# Patient Record
Sex: Female | Born: 1980 | Race: White | Hispanic: No | Smoking: Current every day smoker
Health system: Southern US, Community
[De-identification: ages and names within clinical notes are randomized; demographics above are authoritative.]

## PROBLEM LIST (undated history)

## (undated) DIAGNOSIS — G8929 Other chronic pain: Secondary | ICD-10-CM

## (undated) DIAGNOSIS — M549 Dorsalgia, unspecified: Secondary | ICD-10-CM

## (undated) DIAGNOSIS — R52 Pain, unspecified: Secondary | ICD-10-CM

## (undated) HISTORY — PX: BACK SURGERY: SHX140

## (undated) HISTORY — PX: TOOTH EXTRACTION: SUR596

## (undated) HISTORY — PX: CHOLECYSTECTOMY: SHX55

## (undated) HISTORY — PX: ABDOMINAL HYSTERECTOMY: SHX81

---

## 2015-04-09 ENCOUNTER — Emergency Department (HOSPITAL_COMMUNITY)
Admission: EM | Admit: 2015-04-09 | Discharge: 2015-04-10 | Disposition: A | Discharge status: Home / Self Care | Payer: Self-pay | Attending: Emergency Medicine | Admitting: Emergency Medicine

## 2015-04-09 ENCOUNTER — Encounter (HOSPITAL_COMMUNITY): Payer: Self-pay | Admitting: Emergency Medicine

## 2015-04-09 DIAGNOSIS — Z720 Tobacco use: Secondary | ICD-10-CM | POA: Insufficient documentation

## 2015-04-09 DIAGNOSIS — N939 Abnormal uterine and vaginal bleeding, unspecified: Secondary | ICD-10-CM | POA: Insufficient documentation

## 2015-04-09 DIAGNOSIS — Z90710 Acquired absence of both cervix and uterus: Secondary | ICD-10-CM | POA: Insufficient documentation

## 2015-04-09 DIAGNOSIS — Z793 Long term (current) use of hormonal contraceptives: Secondary | ICD-10-CM | POA: Insufficient documentation

## 2015-04-09 DIAGNOSIS — IMO0002 Reserved for concepts with insufficient information to code with codable children: Secondary | ICD-10-CM

## 2015-04-09 DIAGNOSIS — N941 Dyspareunia: Secondary | ICD-10-CM | POA: Insufficient documentation

## 2015-04-09 DIAGNOSIS — Z79899 Other long term (current) drug therapy: Secondary | ICD-10-CM | POA: Insufficient documentation

## 2015-04-09 MED ORDER — OXYCODONE-ACETAMINOPHEN 5-325 MG PO TABS
1.0000 | ORAL_TABLET | Freq: Once | ORAL | Status: AC
  Administered 2015-04-09: 1 via ORAL
  Filled 2015-04-09: qty 1

## 2015-04-09 NOTE — ED Notes (Signed)
Pt. Reports having surgery 7 weeks ago to fix lesions from previous hysterectomy surgery. Pt. Reports bleeding today.

## 2015-04-10 MED ORDER — OXYCODONE-ACETAMINOPHEN 5-325 MG PO TABS: 1.0000 | ORAL_TABLET | Freq: Four times a day (QID) | ORAL | Status: DC | PRN

## 2015-04-10 NOTE — Discharge Instructions (Signed)
You were seen today for painful sex and some bleeding after sex.  Your vaginal cuff appears intact.  You need to follow-up with your OB on Tuesday.  Avoid sexual activity until you talk to you obstetrician.  Dyspareunia Dyspareunia is pain during sexual intercourse. It is most common in women, but it also happens in men.  CAUSES  Female The pain from this condition is usually felt when anything is put into the vagina, but any part of the genitals may cause pain during sex. Even sitting or wearing pants can cause pain. Sometimes, a cause cannot be found. Some causes of pain during intercourse are:  Infections of the skin around the vagina.  Vaginal infections, such as a yeast, bacterial, or viral infection.  Vaginismus. This is the inability to have anything put in the vagina even when the woman wants it to happen. There is an automatic muscle contraction and pain. The pain of the muscle contraction can be so severe that intercourse is impossible.  Allergic reaction from spermicides, semen, condoms, scented tampons, soaps, douches, and vaginal sprays.  A fluid-filled sac (cyst) on the Bartholin or Skene glands, located at the opening of the vagina.  Scar tissue in the vagina from a surgically enlarged opening (episiotomy) or tearing after delivering a baby.  Vaginal dryness. This is more common in menopause. The normal secretions of the vagina are decreased. Changes in estrogen levels and increased difficulty becoming aroused can cause painful sex. Vaginal dryness can also happen when taking birth control pills.  Thinning of the tissue (atrophy) of the vulva and vagina. This makes the area thinner, smaller, unable to stretch to accommodate a penis, and prone to infection and tearing.  Vulvar vestibulitis or vestibulodynia.This is a condition that causes pain involving the area around the entrance to the vagina.The most common cause in young women is birth control pills.Women with low  estrogen levels (postmenopausal women) may also experience this.Other causes include allergic reactions, too many nerve endings, skin conditions, and pelvic muscles that cannot relax.  Vulvar dermatoses. This includes skin conditions such as lichen sclerosus and lichen planus.  Lack of foreplay to lubricate the vagina. This can cause vaginal dryness.  Noncancerous tumors (fibroids) in the uterus.  Uterus lining tissue growing outside the uterus (endometriosis).  Pregnancy that starts in the fallopian tube (tubal pregnancy).  Pregnancy or breastfeeding your baby. This can cause vaginal dryness.  A tilting or prolapse of the uterus. Prolapse is when weak and stretched muscles around the uterus allow it to fall into the vagina.  Problems with the ovaries, cysts, or scar tissue. This may be worse with certain sexual positions.  Previous surgeries causing adhesions or scar tissue in the vagina or pelvis.  Bladder and intestinal problems.  Psychological problems (such as depression or anxiety). This may make pain worse.  Negative attitudes about sex, experiencing rape, sexual assault, and misinformation about sex. These issues are often related to some types of pain.  Previous pelvic infection, causing scar tissue in the pelvis and on the female organs.  Cyst or tumor on the ovary.  Cancer of the female organs.  Certain medicines.  Medical problems such as diabetes, arthritis, or thyroid disease. Female In men, there are many physical causes of sexual discomfort. Some causes of pain during intercourse are:  Infections of the prostate, bladder, or seminal vesicles. This can cause pain after ejaculation.  An inflamed bladder (interstitial cystitis). This may cause pain from ejaculation.  Gonorrheal infections. This may cause  pain during ejaculation.  An inflamed urethra (urethritis) or inflamed prostate (prostatitis). This can make genital stimulation painful or  uncomfortable.  Deformities of the penis, such as Peyronie's disease.  A tight foreskin.  Cancer of the female organs.  Psychological problems. This may make pain worse. DIAGNOSIS   Your caregiver will take a history and have you describe where the pain is located (outside the vagina, in the vagina, in the pelvis). You may be asked when you experience pain, such as with penetration or with thrusting.  Following this, your caregiver will do a physical exam. Let your caregiver know if the exam is too painful.  During the final part of the female exam, your caregiver will feel your uterus and ovaries with one hand on the abdomen and one finger in your vagina. This is a pelvic exam.  Blood tests, a Pap test, cultures for infection, an ultrasound test, and X-rays may be done. You may need to see a specialist for female problems (gynecologist).  Your caregiver may do a CT scan, MRI, or laparoscopy. Laparoscopy is a procedure to look into the pelvis with a lighted tube, through a cut (incision) in the abdomen. TREATMENT  Your caregiver can help you determine the best course of treatment. Sometimes, more testing is done. Continue with the suggested testing until your caregiver feels sure about your diagnosis and how to treat it. Sometimes, it is difficult to find the reason for the pain. The search for the cause and treatment can be frustrating. Treatment often takes several weeks to a few months before you notice any improvement. You may also need to avoid sexual activity until symptoms improve.Continuing to have sex when it hurts can delay healing and actually make the problem worse. The treatment depends on the cause of the pain. Treatment may include:  Medicines such as antibiotics, vaginal or skin creams, hormones, or antidepressants.  Minor or major surgery.  Psychological counseling or group therapy.  Kegel exercises and vaginal dilators to help certain cases of vaginismus (spasms). Do  this only if recommended by your caregiver.Kegel exercises can make some problems worse.  Applying lubrication as recommended by your caregiver if you have dryness.  Sex therapy for you and your sex partner. It is common for the pain to continue after the reason for the pain has been treated. Some reasons for this include a conditioned response. This means the person having the pain becomes so familiar with the pain that the pain continues as a response, even though the cause is removed. Sex therapy can help with this problem. HOME CARE INSTRUCTIONS   Follow your caregiver's instructions about taking medicines, tests, counseling, and follow-up treatment.  Do not use scented tampons, douches, vaginal sprays, or soaps.  Use water-based lubricants for dryness. Oil lubricants can cause irritation.  Do not use spermicides or condoms that irritate you.  Openly discuss with your partner your sexual experience, your desires, foreplay, and different sexual positions for a more comfortable and enjoyable sexual relationship.  Join group sessions for therapy, if needed.  Practice safe sex at all times.  Empty your bladder before having intercourse.  Try different positions during sexual intercourse.  Take over-the-counter pain medicine recommended by your caregiver before having sexual intercourse.  Do not wear pantyhose. Knee-high and thigh-high hose are okay.  Avoid scrubbing your vulva with a washcloth. Wash the area gently and pat dry with a towel. SEEK MEDICAL CARE IF:   You develop vaginal bleeding after sexual intercourse.  You develop a lump at the opening of your vagina, even if it is not painful.  You have abnormal vaginal discharge.  You have vaginal dryness.  You have itching or irritation of the vulva or vagina.  You develop a rash or reaction to your medicine. SEEK IMMEDIATE MEDICAL CARE IF:   You develop severe abdominal pain during or shortly after sexual  intercourse. You could have a ruptured ovarian cyst or ruptured tubal pregnancy.  You have a fever.  You have painful or bloody urination.  You have painful sexual intercourse, and you never had it before.  You pass out after having sexual intercourse. Document Released: 10/14/2007 Document Revised: 12/17/2011 Document Reviewed: 12/25/2010 [facility] Patient Information 2015 [place], [place]. This information is not intended to replace advice given to you by your health care provider. Make sure you discuss any questions you have with your health care provider.

## 2015-04-10 NOTE — ED Provider Notes (Signed)
CSN: [id number]     Arrival date & time 04/09/15  2242 History   First MD Initiated Contact with Patient 04/09/15 2305     Chief Complaint  Patient presents with  . Post-op Problem     (Consider location/radiation/quality/duration/timing/severity/associated sxs/prior Treatment) HPI  This is a 34 year old female with a history of a total abdominal hysterectomy presents with vaginal pain and bleeding. Patient reports that she had a hysterectomy several years ago. 7 weeks ago she had revision. When asked if she has a cervix, patient states "I have nothing." Patient states that she had sexual intercourse for the first time since having surgery last night. She has reported spotting on and off today and pain in the vaginal vault. She had pain during sex as well. She denies any abdominal pain or urinary symptoms. [facility]  She's not tried anything for the pain at home.  Currently her pain is 8 out of 10.  Patient denies fever, dysuria, nausea, vomiting.  History reviewed. No pertinent past medical history. Past Surgical History  Procedure Laterality Date  . Abdominal hysterectomy     No family history on file. History  Substance Use Topics  . Smoking status: Current Every Day Smoker -- 0.50 packs/day    Types: Cigarettes  . Smokeless tobacco: Not on file  . Alcohol Use: No   OB History    No data available     Review of Systems  Constitutional: Negative for fever.  Gastrointestinal: Negative for nausea, vomiting and abdominal pain.  Genitourinary: Positive for vaginal bleeding and vaginal pain. Negative for dysuria, vaginal discharge and pelvic pain.  All other systems reviewed and are negative.     Allergies  Codeine and Toradol  Home Medications   Prior to Admission medications   Medication Sig Start Date End Date Taking? Authorizing Provider  acetaminophen (TYLENOL) 500 MG tablet Take 500 mg by mouth every 6 (six) hours as needed for mild pain.    Yes Historical Provider, MD  albuterol (PROVENTIL HFA;VENTOLIN HFA) 108 (90 BASE) MCG/ACT inhaler Inhale 2 puffs into the lungs every 6 (six) hours as needed for wheezing or shortness of breath.   Yes Historical Provider, MD  citalopram (CELEXA) 40 MG tablet Take 40 mg by mouth daily.   Yes Historical Provider, MD  estrogens, conjugated, (PREMARIN) 1.25 MG tablet Take 1.25 mg by mouth daily.   Yes Historical Provider, MD  oxyCODONE-acetaminophen (PERCOCET/ROXICET) 5-325 MG per tablet Take 1 tablet by mouth every 6 (six) hours as needed for severe pain. 04/10/15   Shon Batonourtney F Treshawn Allen, MD   BP 105/70 mmHg  Pulse 76  Temp(Src) 98.2 F (36.8 C) (Oral)  Resp 20  Ht 5\' 5"  (1.651 m)  Wt 181 lb (82.101 kg)  BMI 30.12 kg/m2  SpO2 98% Physical Exam  Constitutional: She is oriented to person, place, and time. She appears well-developed and well-nourished.  HENT:  Head: Normocephalic and atraumatic.  Cardiovascular: Normal rate and regular rhythm.   Pulmonary/Chest: Effort normal. No respiratory distress.  Abdominal: Soft. Bowel sounds are normal. There is no tenderness. There is no rebound and no guarding.  Genitourinary:  Normal external vaginal exam, pain with insertion of the vaginal speculum, no lesions noted, no abrasions or trauma noted, no active bleeding, vaginal cuff appears intact  Neurological: She is alert and oriented to person, place, and time.  Skin: Skin is warm and dry.  Psychiatric: She has a normal mood and affect.  Nursing note and  vitals reviewed.   ED Course  Procedures (including critical care time) Labs Review Labs Reviewed - No data to display  Imaging Review No results found.   EKG Interpretation None      MDM   Final diagnoses:  Dyspareunia    Patient presents with dyspareunia and spotting after sex. Nontoxic on exam. Denies abdominal pain or urinary symptoms. Speculum exam shows no active bleeding and an intact vaginal cuff. Patient did have tenderness  with insertion of the speculum. Discussed with patient following up with her OB. In the meantime she will be given a short course of pain medication. She should avoid sexual activity until she follows up with her obstetrician.  After history, exam, and medical workup I feel the patient has been appropriately medically screened and is safe for discharge home. Pertinent diagnoses were discussed with the patient. Patient was given return precautions.     Shon Baton, MD 04/10/15 [phone]

## 2015-04-24 ENCOUNTER — Encounter (HOSPITAL_COMMUNITY): Payer: Self-pay | Admitting: Emergency Medicine

## 2015-04-24 ENCOUNTER — Emergency Department (HOSPITAL_COMMUNITY): Payer: Medicaid Other

## 2015-04-24 ENCOUNTER — Emergency Department (HOSPITAL_COMMUNITY)
Admission: EM | Admit: 2015-04-24 | Discharge: 2015-04-24 | Disposition: A | Discharge status: Home / Self Care | Payer: Medicaid Other | Attending: Emergency Medicine | Admitting: Emergency Medicine

## 2015-04-24 DIAGNOSIS — Y998 Other external cause status: Secondary | ICD-10-CM | POA: Diagnosis not present

## 2015-04-24 DIAGNOSIS — Y92410 Unspecified street and highway as the place of occurrence of the external cause: Secondary | ICD-10-CM | POA: Diagnosis not present

## 2015-04-24 DIAGNOSIS — Z720 Tobacco use: Secondary | ICD-10-CM | POA: Insufficient documentation

## 2015-04-24 DIAGNOSIS — Z79899 Other long term (current) drug therapy: Secondary | ICD-10-CM | POA: Diagnosis not present

## 2015-04-24 DIAGNOSIS — S8991XA Unspecified injury of right lower leg, initial encounter: Secondary | ICD-10-CM | POA: Diagnosis present

## 2015-04-24 DIAGNOSIS — S3992XA Unspecified injury of lower back, initial encounter: Secondary | ICD-10-CM | POA: Diagnosis not present

## 2015-04-24 DIAGNOSIS — R202 Paresthesia of skin: Secondary | ICD-10-CM | POA: Insufficient documentation

## 2015-04-24 DIAGNOSIS — S7001XA Contusion of right hip, initial encounter: Secondary | ICD-10-CM | POA: Diagnosis not present

## 2015-04-24 DIAGNOSIS — Y9389 Activity, other specified: Secondary | ICD-10-CM | POA: Diagnosis not present

## 2015-04-24 MED ORDER — ACETAMINOPHEN 500 MG PO TABS
1000.0000 mg | ORAL_TABLET | Freq: Once | ORAL | Status: AC
  Administered 2015-04-24: 1000 mg via ORAL
  Filled 2015-04-24: qty 2

## 2015-04-24 MED ORDER — BACLOFEN 10 MG PO TABS: 10.0000 mg | ORAL_TABLET | Freq: Three times a day (TID) | ORAL | Status: AC

## 2015-04-24 MED ORDER — DIAZEPAM 5 MG PO TABS
10.0000 mg | ORAL_TABLET | Freq: Once | ORAL | Status: AC
  Administered 2015-04-24: 10 mg via ORAL
  Filled 2015-04-24: qty 2

## 2015-04-24 NOTE — ED Notes (Signed)
Pt states she was on the back of a moving truck and fell off, c/o R leg pain. Pt states she hurts from her hip down.

## 2015-04-24 NOTE — Discharge Instructions (Signed)
Your x-ray is negative for fracture or dislocation. Your previous surgery site appears to be stable. Please use crutches until you can safely apply weight to the right lower extremity. Please see Dr. Romeo AppleHarrison, or the orthopedic specialist of your choice for additional evaluation and management if not improving. Please use baclofen and 1000 mg of Tylenol 3 times daily for pain and bruise burning. Contusion A contusion is a deep bruise. Contusions happen when an injury causes bleeding under the skin. Signs of bruising include pain, puffiness (swelling), and discolored skin. The contusion may turn blue, purple, or yellow. HOME CARE   Put ice on the injured area.  Put ice in a plastic bag.  Place a towel between your skin and the bag.  Leave the ice on for 15-20 minutes, 03-04 times a day.  Only take medicine as told by your doctor.  Rest the injured area.  If possible, raise (elevate) the injured area to lessen puffiness. GET HELP RIGHT AWAY IF:   You have more bruising or puffiness.  You have pain that is getting worse.  Your puffiness or pain is not helped by medicine. MAKE SURE YOU:   Understand these instructions.  Will watch your condition.  Will get help right away if you are not doing well or get worse. Document Released: 03/12/2008 Document Revised: 12/17/2011 Document Reviewed: 07/30/2011 [facility] Patient Information 2015 [place], [place]. This information is not intended to replace advice given to you by your health care provider. Make sure you discuss any questions you have with your health care provider.

## 2015-04-24 NOTE — ED Provider Notes (Signed)
CSN: [id number]     Arrival date & time 04/24/15  1522 History   First MD Initiated Contact with Patient 04/24/15 1558     Chief Complaint  Patient presents with  . Leg Pain     (Consider location/radiation/quality/duration/timing/severity/associated sxs/prior Treatment) Patient is a 34 y.o. female presenting with leg pain. The history is provided by the patient.  Leg Pain Location:  Leg Time since incident:  1 day Injury: yes   Mechanism of injury comment:  Pt fell off a moving truck. Leg location:  R leg Pain details:    Quality:  Tingling, shooting, burning and sharp   Severity:  Moderate   Onset quality:  Sudden   Duration:  1 day   Timing:  Intermittent   Progression:  Worsening Dislocation: no   Prior injury to area:  Yes Relieved by:  Nothing Worsened by:  Bearing weight Ineffective treatments:  Acetaminophen Associated symptoms: back pain and tingling     History reviewed. No pertinent past medical history. Past Surgical History  Procedure Laterality Date  . Abdominal hysterectomy     History reviewed. No pertinent family history. History  Substance Use Topics  . Smoking status: Current Every Day Smoker -- 0.50 packs/day    Types: Cigarettes  . Smokeless tobacco: Never Used  . Alcohol Use: No   OB History    Gravida Para Term Preterm AB TAB SAB Ectopic Multiple Living   3 3 3             Review of Systems  Musculoskeletal: Positive for back pain and arthralgias.  All other systems reviewed and are negative.     Allergies  Codeine and Toradol  Home Medications   Prior to Admission medications   Medication Sig Start Date End Date Taking? Authorizing Provider  acetaminophen (TYLENOL) 500 MG tablet Take 500 mg by mouth every 6 (six) hours as needed for mild pain.   Yes Historical Provider, MD  albuterol (PROVENTIL HFA;VENTOLIN HFA) 108 (90 BASE) MCG/ACT inhaler Inhale 2 puffs into the lungs every 6 (six) hours as needed for wheezing or shortness of  breath.   Yes Historical Provider, MD  citalopram (CELEXA) 40 MG tablet Take 40 mg by mouth daily.   Yes Historical Provider, MD  estrogens, conjugated, (PREMARIN) 1.25 MG tablet Take 1.25 mg by mouth at bedtime.    Yes Historical Provider, MD  oxyCODONE-acetaminophen (PERCOCET/ROXICET) 5-325 MG per tablet Take 1 tablet by mouth every 6 (six) hours as needed for severe pain. 04/10/15   Shon Batonourtney F Horton, MD   BP 98/44 mmHg  Pulse 69  Temp(Src) 98.5 F (36.9 C) (Oral)  Resp 16  Ht 5\' 3"  (1.6 m)  Wt 181 lb (82.101 kg)  BMI 32.07 kg/m2  SpO2 100% Physical Exam  Constitutional: She is oriented to person, place, and time. She appears well-developed and well-nourished.  Non-toxic appearance.  HENT:  Head: Normocephalic.  Right Ear: Tympanic membrane and external ear normal.  Left Ear: Tympanic membrane and external ear normal.  Eyes: EOM and lids are normal. Pupils are equal, round, and reactive to light.  Neck: Normal range of motion. Neck supple. Carotid bruit is not present.  Cardiovascular: Normal rate, regular rhythm, normal heart sounds, intact distal pulses and normal pulses.   Pulmonary/Chest: Breath sounds normal. No respiratory distress.  Abdominal: Soft. Bowel sounds are normal. There is no tenderness. There is no guarding.  Musculoskeletal: Normal range of motion.  There is soreness of the right lower back and right  hip area there is a small bruise area of the right hip. There is no palpable hematoma or deformity of the right thigh. There is good range of motion of the right knee ankles and toes. The dorsalis pedis pulses 2+ bilaterally. Capillary refill is less than 2 seconds.  Lymphadenopathy:       Head (right side): No submandibular adenopathy present.       Head (left side): No submandibular adenopathy present.    She has no cervical adenopathy.  Neurological: She is alert and oriented to person, place, and time. She has normal strength. No cranial nerve deficit or sensory  deficit.  Patient walks with a slight limp, but he can ambulate to the bathroom and back to her room. No gross neurologic deficits appreciated at this time.  Skin: Skin is warm and dry.  Psychiatric: She has a normal mood and affect. Her speech is normal.  Nursing note and vitals reviewed.   ED Course  Procedures (including critical care time) Labs Review Labs Reviewed - No data to display  Imaging Review No results found.   EKG Interpretation None      MDM  X-ray of the right hip and pelvis are negative for fracture, dislocation, or fracture of hardware from previous fusion surgery of the lumbar spine. No gross neurologic deficits appreciated at this time. The plan at this time is for the patient to be on baclofen 3 times daily. The patient is to use [phone] mg of Tylenol along with 500 mg of baclofen to assist with her discomfort. Patient is given the name of Dr. Romeo Apple for orthopedic evaluation if not improving.    Final diagnoses:  None    *I have reviewed nursing notes, vital signs, and all appropriate lab and imaging results for this patient.    Ivery Quale, PA-C 04/24/15 1654  Donnetta Hutching, MD 04/24/15 [phone]

## 2015-05-01 ENCOUNTER — Emergency Department (HOSPITAL_COMMUNITY): Payer: Medicaid Other

## 2015-05-01 ENCOUNTER — Emergency Department (HOSPITAL_COMMUNITY)
Admission: EM | Admit: 2015-05-01 | Discharge: 2015-05-01 | Disposition: A | Discharge status: Home / Self Care | Payer: Medicaid Other | Attending: Emergency Medicine | Admitting: Emergency Medicine

## 2015-05-01 ENCOUNTER — Encounter (HOSPITAL_COMMUNITY): Payer: Self-pay | Admitting: Emergency Medicine

## 2015-05-01 DIAGNOSIS — Z720 Tobacco use: Secondary | ICD-10-CM | POA: Diagnosis not present

## 2015-05-01 DIAGNOSIS — Z9889 Other specified postprocedural states: Secondary | ICD-10-CM | POA: Diagnosis not present

## 2015-05-01 DIAGNOSIS — Z79899 Other long term (current) drug therapy: Secondary | ICD-10-CM | POA: Insufficient documentation

## 2015-05-01 DIAGNOSIS — M533 Sacrococcygeal disorders, not elsewhere classified: Secondary | ICD-10-CM | POA: Diagnosis not present

## 2015-05-01 DIAGNOSIS — S39012D Strain of muscle, fascia and tendon of lower back, subsequent encounter: Secondary | ICD-10-CM | POA: Diagnosis not present

## 2015-05-01 DIAGNOSIS — M25551 Pain in right hip: Secondary | ICD-10-CM | POA: Diagnosis present

## 2015-05-01 MED ORDER — HYDROCODONE-ACETAMINOPHEN 5-325 MG PO TABS
ORAL_TABLET | ORAL | Status: AC
  Filled 2015-05-01: qty 1

## 2015-05-01 MED ORDER — HYDROCODONE-ACETAMINOPHEN 5-325 MG PO TABS: 1.0000 | ORAL_TABLET | ORAL | Status: DC | PRN

## 2015-05-01 MED ORDER — METHOCARBAMOL 500 MG PO TABS: 500.0000 mg | ORAL_TABLET | Freq: Three times a day (TID) | ORAL | Status: DC

## 2015-05-01 MED ORDER — DEXAMETHASONE 4 MG PO TABS: 4.0000 mg | ORAL_TABLET | Freq: Two times a day (BID) | ORAL | Status: DC

## 2015-05-01 MED ORDER — HYDROCODONE-ACETAMINOPHEN 5-325 MG PO TABS
1.0000 | ORAL_TABLET | Freq: Once | ORAL | Status: AC
  Administered 2015-05-01: 1 via ORAL

## 2015-05-01 MED ORDER — DIAZEPAM 5 MG PO TABS
ORAL_TABLET | ORAL | Status: AC
  Filled 2015-05-01: qty 1

## 2015-05-01 MED ORDER — DIAZEPAM 5 MG PO TABS
5.0000 mg | ORAL_TABLET | Freq: Once | ORAL | Status: AC
  Administered 2015-05-01: 5 mg via ORAL

## 2015-05-01 NOTE — ED Provider Notes (Signed)
CSN: [id number]     Arrival date & time 05/01/15  1638 History   This chart was scribed for Alyssa Quale, PA-C working with No att. providers found by Elveria Rising, ED Scribe. This patient was seen in room APFT22/APFT22 and the patient's care was started at 4:53 PM.   Chief Complaint  Patient presents with  . Hip Pain   The history is provided by the patient. No language interpreter was used.   HPI Comments: Alyssa Graham is a 34 y.o. female who presents to the Emergency Department complaining of unresolved right hip pain since fall 7/16. Patient evaluated 7/16 with complaint of right leg pain after falling from a moving truck. Imaging of lumbar spine and right hip and pelvis performed, resulted negatively. Patient discharged with Baclofen and advised to supplement this with Tylenol. Referral given for orthopedic follow up. Patient denies any improvement of her pain with treatment. Patient reports paresthesia in her sacrum especially when sitting and getting up from seated positions. Patient reports exacerbated pain at right hip with bearing weight on right leg; she characterizes this pain as feeling as if her "hip is going to pop out of place" or "give out." Patient ambulatory with crutches provided at her initial visit. Patient reports intermittent tightening pain in her right buttocks and radiation of sharp shooting pain down length of right leg with increased activity. Patient shares that she has an appointment with, Dr. Romeo Apple, the orthopedist 8/01. Patient reports history of lumbar back surgery 1.5 years ago and fears to she has complicated this with her injury. Patient denies bladder/bowel incontinence, numbness or weakness.   History reviewed. No pertinent past medical history. Past Surgical History  Procedure Laterality Date  . Abdominal hysterectomy    . Back surgery    . Cholecystectomy     History reviewed. No pertinent family history. History  Substance Use Topics  .  Smoking status: Current Every Day Smoker -- 0.50 packs/day    Types: Cigarettes  . Smokeless tobacco: Never Used  . Alcohol Use: No   OB History    Gravida Para Term Preterm AB TAB SAB Ectopic Multiple Living   Review of Systems  Constitutional: Negative for fever and chills.  Musculoskeletal: Positive for arthralgias.   Allergies  Codeine and Toradol  Home Medications   Prior to Admission medications   Medication Sig Start Date End Date Taking? Authorizing Provider  acetaminophen (TYLENOL) 500 MG tablet Take 500 mg by mouth every 6 (six) hours as needed for mild pain.    Historical Provider, MD  albuterol (PROVENTIL HFA;VENTOLIN HFA) 108 (90 BASE) MCG/ACT inhaler Inhale 2 puffs into the lungs every 6 (six) hours as needed for wheezing or shortness of breath.    Historical Provider, MD  baclofen (LIORESAL) 10 MG tablet Take 1 tablet (10 mg total) by mouth 3 (three) times daily. 04/24/15 05/24/15  Alyssa Quale, PA-C  citalopram (CELEXA) 40 MG tablet Take 40 mg by mouth daily.    Historical Provider, MD  estrogens, conjugated, (PREMARIN) 1.25 MG tablet Take 1.25 mg by mouth at bedtime.     Historical Provider, MD  oxyCODONE-acetaminophen (PERCOCET/ROXICET) 5-325 MG per tablet Take 1 tablet by mouth every 6 (six) hours as needed for severe pain. 04/10/15   Shon Baton, MD   Triage Vitals: BP 118/62 mmHg  Pulse 83  Temp(Src) 98.2 F (36.8 C) (Oral)  Resp 20  Ht 5'  5" (1.651 m)  Wt 163 lb (73.936 kg)  BMI 27.12 kg/m2  SpO2 99% Physical Exam  Constitutional: She is oriented to person, place, and time. She appears well-developed and well-nourished. No distress.  Patient uncomfortable appearing.   HENT:  Head: Normocephalic and atraumatic.  Eyes: EOM are normal.  Neck: Neck supple. No tracheal deviation present.  Cardiovascular: Normal rate.   Pulmonary/Chest: Effort normal. No respiratory distress.  Musculoskeletal: Normal range of motion.  Pain to  palpation of lumbar spine and pain to palpation of the coccyx area. Patient will not allow complete evaluation of right hip due to pain.   Neurological: She is alert and oriented to person, place, and time.  No gross neurologic deficits appreciated of lower extremities. Good sensory and motor function. No foot drop.   Skin: Skin is warm and dry.  Psychiatric: She has a normal mood and affect. Her behavior is normal.  Nursing note and vitals reviewed.   ED Course  Procedures (including critical care time)  COORDINATION OF CARE: 5:15 PM- Will obtain Xray of coccyx. Discussed treatment plan with patient at bedside and patient agreed to plan.   Labs Review Labs Reviewed - No data to display  Imaging Review No results found.   EKG Interpretation None      MDM  X-ray of the sacrum and coccyx is negative for fracture or dislocation. Again the postoperative changes at the L5-S1 area appear to be intact. I've discussed with the patient that she possibly will need an MRI to further evaluate this issue, as x-rays of the pelvis, L-spine, and coccyx have been negative up to this point. The patient is scheduled to see the orthopedic specialist on August 1. Rx for norco given to the patient.   Final diagnoses:  None    *I have reviewed nursing notes, vital signs, and all appropriate lab and imaging results for this patient.**  **I personally performed the services described in this documentation, which was scribed in my presence. The recorded information has been reviewed and is accurate.Alyssa Quale, PA-C 05/01/15 1806  Eber Hong, MD 05/02/15 [phone]

## 2015-05-01 NOTE — ED Notes (Addendum)
PA at bedside.

## 2015-05-01 NOTE — ED Notes (Signed)
PT c/o continued right hip pain since 04/23/15 fall and was evaluated in ED at this time and c/o pain medications not effective for relief. PT ambulatory in triage without any assistance from crutches prescribed on visit. PT states follow up at Dr. Romeo Apple scheduled for 05/09/15.

## 2015-05-01 NOTE — ED Notes (Signed)
Patient with no complaints at this time. Respirations even and unlabored. Skin warm/dry. Discharge instructions reviewed with patient at this time. Patient given opportunity to voice concerns/ask questions. Patient discharged at this time and left Emergency Department with steady gait.   

## 2015-05-01 NOTE — Discharge Instructions (Signed)
The x-ray of your coccyx bone is negative. Review of your previous x-rays reveals the lumbar spine is without fracture. The hardware from your previous surgery appears to be intact. The x-ray of the hip and pelvis are negative for fracture or dislocation. Please see Dr. Romeo Apple for additional evaluation concerning your continued hip and back area pain. Please use Robaxin and Decadron daily. Please take these medications with food. Please use Norco for pain if needed. Norco and Robaxin may cause drowsiness, please use these medications with caution. Lumbosacral Strain Lumbosacral strain is a strain of any of the parts that make up your lumbosacral vertebrae. Your lumbosacral vertebrae are the bones that make up the lower third of your backbone. Your lumbosacral vertebrae are held together by muscles and tough, fibrous tissue (ligaments).  CAUSES  A sudden blow to your back can cause lumbosacral strain. Also, anything that causes an excessive stretch of the muscles in the low back can cause this strain. This is typically seen when people exert themselves strenuously, fall, lift heavy objects, bend, or crouch repeatedly. RISK FACTORS  Physically demanding work.  Participation in pushing or pulling sports or sports that require a sudden twist of the back (tennis, golf, baseball).  Weight lifting.  Excessive lower back curvature.  Forward-tilted pelvis.  Weak back or abdominal muscles or both.  Tight hamstrings. SIGNS AND SYMPTOMS  Lumbosacral strain may cause pain in the area of your injury or pain that moves (radiates) down your leg.  DIAGNOSIS Your health care provider can often diagnose lumbosacral strain through a physical exam. In some cases, you may need tests such as X-ray exams.  TREATMENT  Treatment for your lower back injury depends on many factors that your clinician will have to evaluate. However, most treatment will include the use of anti-inflammatory medicines. HOME CARE  INSTRUCTIONS   Avoid hard physical activities (tennis, racquetball, waterskiing) if you are not in proper physical condition for it. This may aggravate or create problems.  If you have a back problem, avoid sports requiring sudden body movements. Swimming and walking are generally safer activities.  Maintain good posture.  Maintain a healthy weight.  For acute conditions, you may put ice on the injured area.  Put ice in a plastic bag.  Place a towel between your skin and the bag.  Leave the ice on for 20 minutes, 2-3 times a day.  When the low back starts healing, stretching and strengthening exercises may be recommended. SEEK MEDICAL CARE IF:  Your back pain is getting worse.  You experience severe back pain not relieved with medicines. SEEK IMMEDIATE MEDICAL CARE IF:   You have numbness, tingling, weakness, or problems with the use of your arms or legs.  There is a change in bowel or bladder control.  You have increasing pain in any area of the body, including your belly (abdomen).  You notice shortness of breath, dizziness, or feel faint.  You feel sick to your stomach (nauseous), are throwing up (vomiting), or become sweaty.  You notice discoloration of your toes or legs, or your feet get very cold. MAKE SURE YOU:   Understand these instructions.  Will watch your condition.  Will get help right away if you are not doing well or get worse. Document Released: 07/04/2005 Document Revised: 09/29/2013 Document Reviewed: 05/13/2013 [facility] Patient Information 2015 [place], [place]. This information is not intended to replace advice given to you by your health care provider. Make sure you discuss any questions you have with your  health care provider. ° °

## 2015-05-01 NOTE — ED Notes (Signed)
Discharge error

## 2015-05-12 ENCOUNTER — Emergency Department
Admission: EM | Admit: 2015-05-12 | Discharge: 2015-05-12 | Disposition: A | Discharge status: Home / Self Care | Payer: Medicaid Other | Attending: Emergency Medicine | Admitting: Emergency Medicine

## 2015-05-12 ENCOUNTER — Encounter: Payer: Self-pay | Admitting: Emergency Medicine

## 2015-05-12 ENCOUNTER — Emergency Department: Payer: Medicaid Other

## 2015-05-12 DIAGNOSIS — S8991XA Unspecified injury of right lower leg, initial encounter: Secondary | ICD-10-CM | POA: Diagnosis present

## 2015-05-12 DIAGNOSIS — Y9289 Other specified places as the place of occurrence of the external cause: Secondary | ICD-10-CM | POA: Insufficient documentation

## 2015-05-12 DIAGNOSIS — Y9301 Activity, walking, marching and hiking: Secondary | ICD-10-CM | POA: Diagnosis not present

## 2015-05-12 DIAGNOSIS — Z720 Tobacco use: Secondary | ICD-10-CM | POA: Diagnosis not present

## 2015-05-12 DIAGNOSIS — Z79899 Other long term (current) drug therapy: Secondary | ICD-10-CM | POA: Insufficient documentation

## 2015-05-12 DIAGNOSIS — Y998 Other external cause status: Secondary | ICD-10-CM | POA: Diagnosis not present

## 2015-05-12 DIAGNOSIS — W1849XA Other slipping, tripping and stumbling without falling, initial encounter: Secondary | ICD-10-CM | POA: Insufficient documentation

## 2015-05-12 DIAGNOSIS — M25561 Pain in right knee: Secondary | ICD-10-CM

## 2015-05-12 MED ORDER — OXYCODONE-ACETAMINOPHEN 5-325 MG PO TABS: 1.0000 | ORAL_TABLET | Freq: Three times a day (TID) | ORAL | Status: DC | PRN

## 2015-05-12 NOTE — ED Notes (Signed)
Patient to ER for c/o right knee pain. Reports feeling pop.

## 2015-05-12 NOTE — ED Notes (Signed)
Pt states pain in right leg, pt states she was walking and had sudden onset of pain, pt states it feels as if her knee is rubbing bone to bone, pt ambulatory to room

## 2015-05-12 NOTE — ED Provider Notes (Signed)
[facility] Emergency Department Provider Note  ____________________________________________  Time seen: Approximately 3:25 PM  I have reviewed the triage vital signs and the nursing notes.   HISTORY  Chief Complaint Knee Pain   HPI Alyssa Graham is a 34 y.o. female presents to ER for complaints of right knee pain. Patient reports that just prior to arrival she was walking and stepped up on a curve and states that after she stepped up she had a sudden onset of right knee pain in the front of her knee. Patient states that it popped. Denies fall or other injury. Denies pain to her right knee on a regular basis. Denies pain radiation, numbness, tingling or other pain. Again denies fall or other injury. Reports pain is currently 8 out of 10 and aching throbbing pain. Patient states the pain is primarily with movement. Patient states able to a break it hurts to walk on it. States pain is unrelieved with over-the-counter Tylenol or ibuprofen.   History reviewed. No pertinent past medical history.  There are no active problems to display for this patient.   Past Surgical History  Procedure Laterality Date  . Abdominal hysterectomy    . Back surgery    . Cholecystectomy      Current Outpatient Rx  Name  Route  Sig  Dispense  Refill             . albuterol (PROVENTIL HFA;VENTOLIN HFA) 108 (90 BASE) MCG/ACT inhaler   Inhalation   Inhale 2 puffs into the lungs every 6 (six) hours as needed for wheezing or shortness of breath.         . baclofen (LIORESAL) 10 MG tablet   Oral   Take 1 tablet (10 mg total) by mouth 3 (three) times daily. Patient not taking: Reported on 05/01/2015   21 each   0   . citalopram (CELEXA) 40 MG tablet   Oral   Take 40 mg by mouth daily.         .           .           .           .           .             Allergies Codeine and Toradol  No family history on file.  Social History History  Substance Use Topics   . Smoking status: Current Every Day Smoker -- 0.50 packs/day    Types: Cigarettes  . Smokeless tobacco: Never Used  . Alcohol Use: No    Review of Systems Constitutional: No fever/chills Eyes: No visual changes. ENT: No sore throat. Cardiovascular: Denies chest pain. Respiratory: Denies shortness of breath. Gastrointestinal: No abdominal pain.  No nausea, no vomiting.  No diarrhea.  No constipation. Genitourinary: Negative for dysuria. Musculoskeletal: Negative for back pain. Positive for right knee pain. Skin: Negative for rash. Neurological: Negative for headaches, focal weakness or numbness.  10-point ROS otherwise negative.  ____________________________________________   PHYSICAL EXAM:  VITAL SIGNS: ED Triage Vitals  Enc Vitals Group     BP 05/12/15 1335 108/55 mmHg     Pulse Rate 05/12/15 1335 90     Resp 05/12/15 1335 20     Temp 05/12/15 1335 97.5 F (36.4 C)     Temp Source 05/12/15 1335 Oral     SpO2 05/12/15 1335 96 %     Weight 05/12/15 1335  162 lb (73.483 kg)     Height 05/12/15 1335 5\' 5"  (1.651 m)     Head Cir --      Peak Flow --      Pain Score 05/12/15 1335 8     Pain Loc --      Pain Edu? --      Excl. in GC? --     Constitutional: Alert and oriented. Well appearing and in no acute distress. Eyes: Conjunctivae are normal. PERRL. EOMI. Head: Atraumatic.  Nose: No congestion/rhinnorhea.  Mouth/Throat: Mucous membranes are moist.  Oropharynx non-erythematous. Neck: No stridor.  No cervical spine tenderness to palpation. Hematological/Lymphatic/Immunilogical: No cervical lymphadenopathy. Cardiovascular: Normal rate, regular rhythm. Grossly normal heart sounds.  Good peripheral circulation. Respiratory: Normal respiratory effort.  No retractions. Lungs CTAB. Gastrointestinal: Soft and nontender. No distention. Normal Bowel sounds.   No CVA tenderness. Musculoskeletal: No lower or upper extremity tenderness nor edema.  No joint effusions. Bilateral  pedal pulses equal and easily palpated.   Except: Right anterior knee moderate tender to palpation. No swelling or ecchymosis. Pain with anterior and posterior drawer test as well as pain with medial and lateral stress tests. Full range of motion. Pain with flexion and extension. No pain above the right knee and no pain below the right knee. No calf tenderness. Skin intact. Neurologic:  Normal speech and language. No gross focal neurologic deficits are appreciated. No gait instability. Skin:  Skin is warm, dry and intact. No rash noted. Psychiatric: Mood and affect are normal. Speech and behavior are normal.  ____________________________________________   LABS (all labs ordered are listed, but only abnormal results are displayed)  Labs Reviewed - No data to display _ RADIOLOGY  EXAM: RIGHT KNEE - COMPLETE 4+ VIEW  COMPARISON: None  FINDINGS: Osseous mineralization normal for technique.  Joint spaces preserved.  No acute fracture, dislocation or bone destruction.  No knee joint effusion.  IMPRESSION: No acute osseous abnormalities.   Electronically Signed By: Ulyses Southward M.D. On: 05/12/2015 16:06  I, Renford Dills, personally viewed and evaluated these images as part of my medical decision making.   ____________________________________________   PROCEDURES  Procedure(s) performed:  SPLINT APPLICATION Date/Time: 4:36 PM Authorized by: Renford Dills Consent: Verbal consent obtained. Risks and benefits: risks, benefits and alternatives were discussed Consent given by: patient Splint applied by: ed technician Location details: right knee Splint type: right knee immobilizer and crutches  Post-procedure: The splinted body part was neurovascularly unchanged following the procedure. Patient tolerance: Patient tolerated the procedure well with no immediate complications.     INITIAL IMPRESSION / ASSESSMENT AND PLAN / ED COURSE  Pertinent labs & imaging  results that were available during my care of the patient were reviewed by me and considered in my medical decision making (see chart for details).  Very well-appearing patient. No acute distress. Presents the ER for the right anterior knee pain post stepping up on a curb. Denies fall or other injury. Right knee tender to anterior knee with pain with all range of motion. Suspect strain injury. Will place patient and crutches and knee immobilizer. Discussed ice and elevation and rest. Over-the-counter ibuprofen, patient states pain unrelieved by over-the-counter ibuprofen will give patient quantity #8 Percocet. Follow-up with orthopedics. Patient verbalized understanding and agreed to plan. ____________________________________________   FINAL CLINICAL IMPRESSION(S) / ED DIAGNOSES  Final diagnoses:  Right knee pain      Renford Dills, NP 05/12/15 1636  Governor Rooks, MD 05/13/15 2102

## 2015-05-12 NOTE — Discharge Instructions (Signed)
Take medication as prescribed. Apply ice and rest. Take over-the-counter ibuprofen as needed for pain. Use right knee immobilizer and crutches as long as pain continues. Gradually apply weight as tolerated.  Follow-up with orthopedist next week as needed for continued pain. Return to the ER for new or worsening concerns.  Knee Pain The knee is the complex joint between your thigh and your lower leg. It is made up of bones, tendons, ligaments, and cartilage. The bones that make up the knee are:  The femur in the thigh.  The tibia and fibula in the lower leg.  The patella or kneecap riding in the groove on the lower femur. CAUSES  Knee pain is a common complaint with many causes. A few of these causes are:  Injury, such as:  A ruptured ligament or tendon injury.  Torn cartilage.  Medical conditions, such as:  Gout  Arthritis  Infections  Overuse, over training, or overdoing a physical activity. Knee pain can be minor or severe. Knee pain can accompany debilitating injury. Minor knee problems often respond well to self-care measures or get well on their own. More serious injuries may need medical intervention or even surgery. SYMPTOMS The knee is complex. Symptoms of knee problems can vary widely. Some of the problems are:  Pain with movement and weight bearing.  Swelling and tenderness.  Buckling of the knee.  Inability to straighten or extend your knee.  Your knee locks and you cannot straighten it.  Warmth and redness with pain and fever.  Deformity or dislocation of the kneecap. DIAGNOSIS  Determining what is wrong may be very straight forward such as when there is an injury. It can also be challenging because of the complexity of the knee. Tests to make a diagnosis may include:  Your caregiver taking a history and doing a physical exam.  Routine X-rays can be used to rule out other problems. X-rays will not reveal a cartilage tear. Some injuries of the knee can  be diagnosed by:  Arthroscopy a surgical technique by which a small video camera is inserted through tiny incisions on the sides of the knee. This procedure is used to examine and repair internal knee joint problems. Tiny instruments can be used during arthroscopy to repair the torn knee cartilage (meniscus).  Arthrography is a radiology technique. A contrast liquid is directly injected into the knee joint. Internal structures of the knee joint then become visible on X-ray film.  An MRI scan is a non X-ray radiology procedure in which magnetic fields and a computer produce two- or three-dimensional images of the inside of the knee. Cartilage tears are often visible using an MRI scanner. MRI scans have largely replaced arthrography in diagnosing cartilage tears of the knee.  Blood work.  Examination of the fluid that helps to lubricate the knee joint (synovial fluid). This is done by taking a sample out using a needle and a syringe. TREATMENT The treatment of knee problems depends on the cause. Some of these treatments are:  Depending on the injury, proper casting, splinting, surgery, or physical therapy care will be needed.  Give yourself adequate recovery time. Do not overuse your joints. If you begin to get sore during workout routines, back off. Slow down or do fewer repetitions.  For repetitive activities such as cycling or running, maintain your strength and nutrition.  Alternate muscle groups. For example, if you are a weight lifter, work the upper body on one day and the lower body the next.  Either  tight or weak muscles do not give the proper support for your knee. Tight or weak muscles do not absorb the stress placed on the knee joint. Keep the muscles surrounding the knee strong.  Take care of mechanical problems.  If you have flat feet, orthotics or special shoes may help. See your caregiver if you need help.  Arch supports, sometimes with wedges on the inner or outer aspect of  the heel, can help. These can shift pressure away from the side of the knee most bothered by osteoarthritis.  A brace called an "unloader" brace also may be used to help ease the pressure on the most arthritic side of the knee.  If your caregiver has prescribed crutches, braces, wraps or ice, use as directed. The acronym for this is PRICE. This means protection, rest, ice, compression, and elevation.  Nonsteroidal anti-inflammatory drugs (NSAIDs), can help relieve pain. But if taken immediately after an injury, they may actually increase swelling. Take NSAIDs with food in your stomach. Stop them if you develop stomach problems. Do not take these if you have a history of ulcers, stomach pain, or bleeding from the bowel. Do not take without your caregiver's approval if you have problems with fluid retention, heart failure, or kidney problems.  For ongoing knee problems, physical therapy may be helpful.  Glucosamine and chondroitin are over-the-counter dietary supplements. Both may help relieve the pain of osteoarthritis in the knee. These medicines are different from the usual anti-inflammatory drugs. Glucosamine may decrease the rate of cartilage destruction.  Injections of a corticosteroid drug into your knee joint may help reduce the symptoms of an arthritis flare-up. They may provide pain relief that lasts a few months. You may have to wait a few months between injections. The injections do have a small increased risk of infection, water retention, and elevated blood sugar levels.  Hyaluronic acid injected into damaged joints may ease pain and provide lubrication. These injections may work by reducing inflammation. A series of shots may give relief for as long as 6 months.  Topical painkillers. Applying certain ointments to your skin may help relieve the pain and stiffness of osteoarthritis. Ask your pharmacist for suggestions. Many over the-counter products are approved for temporary relief of  arthritis pain.  In some countries, doctors often prescribe topical NSAIDs for relief of chronic conditions such as arthritis and tendinitis. A review of treatment with NSAID creams found that they worked as well as oral medications but without the serious side effects. PREVENTION  Maintain a healthy weight. Extra pounds put more strain on your joints.  Get strong, stay limber. Weak muscles are a common cause of knee injuries. Stretching is important. Include flexibility exercises in your workouts.  Be smart about exercise. If you have osteoarthritis, chronic knee pain or recurring injuries, you may need to change the way you exercise. This does not mean you have to stop being active. If your knees ache after jogging or playing basketball, consider switching to swimming, water aerobics, or other low-impact activities, at least for a few days a week. Sometimes limiting high-impact activities will provide relief.  Make sure your shoes fit well. Choose footwear that is right for your sport.  Protect your knees. Use the proper gear for knee-sensitive activities. Use kneepads when playing volleyball or laying carpet. Buckle your seat belt every time you drive. Most shattered kneecaps occur in car accidents.  Rest when you are tired. SEEK MEDICAL CARE IF:  You have knee pain that is continual  and does not seem to be getting better.  SEEK IMMEDIATE MEDICAL CARE IF:  Your knee joint feels hot to the touch and you have a high fever. MAKE SURE YOU:   Understand these instructions.  Will watch your condition.  Will get help right away if you are not doing well or get worse. Document Released: 07/22/2007 Document Revised: 12/17/2011 Document Reviewed: 07/22/2007 [facility] Patient Information 2015 [place], [place]. This information is not intended to replace advice given to you by your health care provider. Make sure you discuss any questions you have with your health care provider.

## 2015-05-16 ENCOUNTER — Telehealth: Payer: Self-pay | Admitting: Orthopedic Surgery

## 2015-05-16 NOTE — Telephone Encounter (Signed)
Patient called, relays had been seen in [facility] Emergency room recently for problems with right knee and hip; states primary care referred her to Dr Hilda Lias; however, said was told Dr Hilda Lias cannot see her.  Due to her insurance requiring a primary care referral, relayed we will need to await a new referral by her primary care provider prior to scheduling.

## 2015-05-20 ENCOUNTER — Emergency Department (HOSPITAL_COMMUNITY)
Admission: EM | Admit: 2015-05-20 | Discharge: 2015-05-20 | Disposition: A | Discharge status: Home / Self Care | Payer: Medicaid Other | Attending: Emergency Medicine | Admitting: Emergency Medicine

## 2015-05-20 ENCOUNTER — Encounter (HOSPITAL_COMMUNITY): Payer: Self-pay | Admitting: Emergency Medicine

## 2015-05-20 DIAGNOSIS — Z79899 Other long term (current) drug therapy: Secondary | ICD-10-CM | POA: Diagnosis not present

## 2015-05-20 DIAGNOSIS — Z720 Tobacco use: Secondary | ICD-10-CM | POA: Insufficient documentation

## 2015-05-20 DIAGNOSIS — Z760 Encounter for issue of repeat prescription: Secondary | ICD-10-CM | POA: Diagnosis present

## 2015-05-20 DIAGNOSIS — M25561 Pain in right knee: Secondary | ICD-10-CM | POA: Diagnosis not present

## 2015-05-20 MED ORDER — NAPROXEN 500 MG PO TABS: 500.0000 mg | ORAL_TABLET | Freq: Two times a day (BID) | ORAL | Status: DC

## 2015-05-20 MED ORDER — OXYCODONE-ACETAMINOPHEN 5-325 MG PO TABS: 1.0000 | ORAL_TABLET | ORAL | Status: DC | PRN

## 2015-05-20 NOTE — Discharge Instructions (Signed)

## 2015-05-20 NOTE — ED Notes (Signed)
Has appointment with Dr Romeo Apple Aug. 28th need medication refill until appointment.  Original injury to right knee was one month ago.  Rates pain 10/10.

## 2015-05-22 NOTE — ED Provider Notes (Signed)
CSN: [id number]     Arrival date & time 05/20/15  1057 History   First MD Initiated Contact with Patient 05/20/15 1125     Chief Complaint  Patient presents with  . Medication Refill     (Consider location/radiation/quality/duration/timing/severity/associated sxs/prior Treatment) The history is provided by the patient.   Alyssa Graham is a 34 y.o. female presenting with persistent right knee pain secondary to multiple injuries, describing initially falling off the back end of a moving truck mid July causing pain in her right pelvis and hip (negative xrays performed here 04/24/15) and since she has developed pain in her right knee, complicated by stepping up onto a curb 8 days ago when there was a loud popping sensation to the anterior knee and has had persistent pain (plain films completed at [facility] on 8/4 and negative) followed up with an MRI by her pcp in [place] (where her current pcp is, pt having just moved to this area) which pt states reveals a torn ACL injury.  She is scheduled to see Dr Romeo Apple  On 8/28, in the interim she has increased pain despite use of her knee immobilizer, elevation and ice packs.  She has found no alleviators for her pain.     History reviewed. No pertinent past medical history. Past Surgical History  Procedure Laterality Date  . Abdominal hysterectomy    . Back surgery    . Cholecystectomy     History reviewed. No pertinent family history. Social History  Substance Use Topics  . Smoking status: Current Every Day Smoker -- 0.50 packs/day    Types: Cigarettes  . Smokeless tobacco: Never Used  . Alcohol Use: No   OB History    Gravida Para Term Preterm AB TAB SAB Ectopic Multiple Living   3 3 3       2      Review of Systems  Constitutional: Negative for fever.  Musculoskeletal: Positive for joint swelling and arthralgias. Negative for myalgias.  Neurological: Negative for weakness and numbness.      Allergies  Codeine and  Toradol  Home Medications   Prior to Admission medications   Medication Sig Start Date End Date Taking? Authorizing Provider  acetaminophen (TYLENOL) 500 MG tablet Take 500-1,500 mg by mouth every 6 (six) hours as needed for mild pain or moderate pain.    Yes Historical Provider, MD  albuterol (PROVENTIL HFA;VENTOLIN HFA) 108 (90 BASE) MCG/ACT inhaler Inhale 2 puffs into the lungs every 6 (six) hours as needed for wheezing or shortness of breath.   Yes Historical Provider, MD  citalopram (CELEXA) 40 MG tablet Take 40 mg by mouth daily.   Yes Historical Provider, MD  estrogens, conjugated, (PREMARIN) 1.25 MG tablet Take 1.25 mg by mouth at bedtime.    Yes Historical Provider, MD  baclofen (LIORESAL) 10 MG tablet Take 1 tablet (10 mg total) by mouth 3 (three) times daily. Patient not taking: Reported on 05/01/2015 04/24/15 05/24/15  Ivery Quale, PA-C  dexamethasone (DECADRON) 4 MG tablet Take 1 tablet (4 mg total) by mouth 2 (two) times daily with a meal. Patient not taking: Reported on 05/20/2015 05/01/15   Ivery Quale, PA-C  methocarbamol (ROBAXIN) 500 MG tablet Take 1 tablet (500 mg total) by mouth 3 (three) times daily. Patient not taking: Reported on 05/20/2015 05/01/15   Ivery Quale, PA-C  naproxen (NAPROSYN) 500 MG tablet Take 1 tablet (500 mg total) by mouth 2 (two) times daily. 05/20/15   Burgess Amor, PA-C  oxyCODONE-acetaminophen (  PERCOCET/ROXICET) 5-325 MG per tablet Take 1 tablet by mouth every 4 (four) hours as needed. 05/20/15   Burgess Amor, PA-C   BP 102/47 mmHg  Pulse 73  Temp(Src) 97.6 F (36.4 C) (Oral)  Resp 16  Ht  (1.651 m)  Wt 162 lb (73.483 kg)  BMI 26.96 kg/m2  SpO2 100% Physical Exam  Constitutional: She appears well-developed and well-nourished.  HENT:  Head: Atraumatic.  Neck: Normal range of motion.  Cardiovascular:  Pulses equal bilaterally  Musculoskeletal:       Right knee: She exhibits decreased range of motion and abnormal patellar mobility. She  exhibits no effusion, no ecchymosis, no erythema, no LCL laxity and no MCL laxity. Tenderness found. Patellar tendon tenderness noted.  No mcl or lcl laxity, although pain with valgus and varus strain.  Negative drawer, positive pain with this maneuver. Mild crepitus with manipulation of the right patella.  No edema, no erythema.  Dorsalis pedal pulses intact and equal.   Neurological: She is alert. She has normal strength. She displays normal reflexes. No sensory deficit.  Skin: Skin is warm and dry.  Psychiatric: She has a normal mood and affect.    ED Course  Procedures (including critical care time) Labs Review Labs Reviewed - No data to display  Imaging Review No results found.  EKG Interpretation None      MDM   Final diagnoses:  Right knee pain    Prior imaging reviewed.  Pt with acute knee pain with ACL per pt report pending eval by Dr Romeo Apple.  Pt was given a small course of oxycodone, advised she will need to make this last until her appt with Dr. Romeo Apple, added naproxen for bid dosing for primary pain management, ice,  Continue using knee immobilizer.  She does not present with crutches. Offered.  Pt states she has at home.  Advised she needs to use her crutches at all times to avoid weight bearing. Pt understands plan.    The patient appears reasonably screened and/or stabilized for discharge and I doubt any other medical condition or other [facility] requiring further screening, evaluation, or treatment in the ED at this time prior to discharge.     Burgess Amor, PA-C 05/22/15 1812  Gerhard Munch, MD 05/23/15 [phone]

## 2015-06-04 ENCOUNTER — Encounter (HOSPITAL_COMMUNITY): Payer: Self-pay | Admitting: Emergency Medicine

## 2015-06-04 ENCOUNTER — Emergency Department (HOSPITAL_COMMUNITY)
Admission: EM | Admit: 2015-06-04 | Discharge: 2015-06-05 | Disposition: A | Discharge status: Home / Self Care | Payer: Medicaid Other | Attending: Emergency Medicine | Admitting: Emergency Medicine

## 2015-06-04 DIAGNOSIS — Z791 Long term (current) use of non-steroidal anti-inflammatories (NSAID): Secondary | ICD-10-CM | POA: Diagnosis not present

## 2015-06-04 DIAGNOSIS — K029 Dental caries, unspecified: Secondary | ICD-10-CM | POA: Insufficient documentation

## 2015-06-04 DIAGNOSIS — K088 Other specified disorders of teeth and supporting structures: Secondary | ICD-10-CM | POA: Insufficient documentation

## 2015-06-04 DIAGNOSIS — K08409 Partial loss of teeth, unspecified cause, unspecified class: Secondary | ICD-10-CM | POA: Insufficient documentation

## 2015-06-04 DIAGNOSIS — Z79899 Other long term (current) drug therapy: Secondary | ICD-10-CM | POA: Insufficient documentation

## 2015-06-04 DIAGNOSIS — R6883 Chills (without fever): Secondary | ICD-10-CM | POA: Insufficient documentation

## 2015-06-04 DIAGNOSIS — Z720 Tobacco use: Secondary | ICD-10-CM | POA: Diagnosis not present

## 2015-06-04 DIAGNOSIS — K0889 Other specified disorders of teeth and supporting structures: Secondary | ICD-10-CM

## 2015-06-04 NOTE — ED Notes (Signed)
Patient had two teeth removed on Thursday and today has swelling in pus from mouth, and breaking out on face.

## 2015-06-05 MED ORDER — CLINDAMYCIN HCL 300 MG PO CAPS: 300.0000 mg | ORAL_CAPSULE | Freq: Four times a day (QID) | ORAL | Status: DC

## 2015-06-05 MED ORDER — HYDROCODONE-ACETAMINOPHEN 5-325 MG PO TABS
1.0000 | ORAL_TABLET | Freq: Once | ORAL | Status: AC
  Administered 2015-06-05: 1 via ORAL
  Filled 2015-06-05: qty 1

## 2015-06-05 MED ORDER — HYDROCODONE-ACETAMINOPHEN 5-325 MG PO TABS: ORAL_TABLET | ORAL | Status: DC

## 2015-06-05 MED ORDER — CLINDAMYCIN HCL 150 MG PO CAPS
300.0000 mg | ORAL_CAPSULE | Freq: Once | ORAL | Status: AC
  Administered 2015-06-05: 300 mg via ORAL
  Filled 2015-06-05: qty 2

## 2015-06-05 NOTE — ED Provider Notes (Signed)
CSN: [id number]     Arrival date & time 06/04/15  2307 History   First MD Initiated Contact with Patient 06/04/15 2328     Chief Complaint  Patient presents with  . Dental Pain     (Consider location/radiation/quality/duration/timing/severity/associated sxs/prior Treatment) HPI   Alyssa Graham is a 34 y.o. female who presents to the Emergency Department complaining of post extraction dental pain and facial swelling for 2 days.  She states that she had two right upper teeth removed 2 days ago and developed increasing pain and swelling of her right face.  She has been rinsing with mouthwash and taking Ibuprofen without relief.  She states she was not given any antibiotics.  She also reports intermittent chills.  Pain is worse with hot or cold foods or liquids.  She denies difficulty swallowing, neck pain, fever, and vomiting.     History reviewed. No pertinent past medical history. Past Surgical History  Procedure Laterality Date  . Abdominal hysterectomy    . Back surgery    . Cholecystectomy    . Tooth extraction     History reviewed. No pertinent family history. Social History  Substance Use Topics  . Smoking status: Current Every Day Smoker -- 0.50 packs/day    Types: Cigarettes  . Smokeless tobacco: Never Used  . Alcohol Use: No   OB History    Gravida Para Term Preterm AB TAB SAB Ectopic Multiple Living   Review of Systems  Constitutional: Negative for fever and appetite change.  HENT: Positive for dental problem and facial swelling. Negative for congestion, sore throat and trouble swallowing.   Eyes: Negative for pain and visual disturbance.  Musculoskeletal: Negative for neck pain and neck stiffness.  Neurological: Negative for dizziness, facial asymmetry and headaches.  Hematological: Negative for adenopathy.  All other systems reviewed and are negative.     Allergies  Codeine and Toradol  Home Medications   Prior to Admission  medications   Medication Sig Start Date End Date Taking? Authorizing Provider  albuterol (PROVENTIL HFA;VENTOLIN HFA) 108 (90 BASE) MCG/ACT inhaler Inhale 2 puffs into the lungs every 6 (six) hours as needed for wheezing or shortness of breath.   Yes Historical Provider, MD  citalopram (CELEXA) 40 MG tablet Take 40 mg by mouth daily.   Yes Historical Provider, MD  estrogens, conjugated, (PREMARIN) 1.25 MG tablet Take 1.25 mg by mouth at bedtime.    Yes Historical Provider, MD  acetaminophen (TYLENOL) 500 MG tablet Take 500-1,500 mg by mouth every 6 (six) hours as needed for mild pain or moderate pain.     Historical Provider, MD  dexamethasone (DECADRON) 4 MG tablet Take 1 tablet (4 mg total) by mouth 2 (two) times daily with a meal. Patient not taking: Reported on 05/20/2015 05/01/15   Ivery Quale, PA-C  methocarbamol (ROBAXIN) 500 MG tablet Take 1 tablet (500 mg total) by mouth 3 (three) times daily. Patient not taking: Reported on 05/20/2015 05/01/15   Ivery Quale, PA-C  naproxen (NAPROSYN) 500 MG tablet Take 1 tablet (500 mg total) by mouth 2 (two) times daily. 05/20/15   Burgess Amor, PA-C  oxyCODONE-acetaminophen (PERCOCET/ROXICET) 5-325 MG per tablet Take 1 tablet by mouth every 4 (four) hours as needed. 05/20/15   Burgess Amor, PA-C   BP 116/56 mmHg  Pulse 75  Temp(Src) 97.9 F (36.6 C) (Oral)  Resp 16  Ht  (1.651 m)  Wt  170 lb (77.111 kg)  BMI 28.29 kg/m2  SpO2 100% Physical Exam  Constitutional: She is oriented to person, place, and time. She appears well-developed and well-nourished. No distress.  HENT:  Head: Normocephalic and atraumatic.  Right Ear: Tympanic membrane and ear canal normal.  Left Ear: Tympanic membrane and ear canal normal.  Mouth/Throat: Uvula is midline, oropharynx is clear and moist and mucous membranes are normal. No trismus in the jaw. Dental caries present. No dental abscesses or uvula swelling.  Recent dental extractions of the right upper premolars,  mild erythema of the adjacent gums. Mild right facial swelling, no obvious dental abscess, trismus, or sublingual abnml.    Neck: Normal range of motion. Neck supple.  Cardiovascular: Normal rate, regular rhythm and normal heart sounds.   No murmur heard. Pulmonary/Chest: Effort normal and breath sounds normal.  Musculoskeletal: Normal range of motion.  Lymphadenopathy:    She has no cervical adenopathy.  Neurological: She is alert and oriented to person, place, and time. She exhibits normal muscle tone. Coordination normal.  Skin: Skin is warm and dry.  Nursing note and vitals reviewed.   ED Course  Procedures (including critical care time) Labs Review Labs Reviewed - No data to display  Imaging Review   EKG Interpretation None      MDM   Final diagnoses:  Pain, dental    Pt is well appearing, non-toxic.  No concerning sx's for Ludwig's angina.  No obvious dental abscess or dry socket.  Pt has f/u appt with her dentist on Friday.  She appears stable for d/c and agrees to Rx for clindamycin and short course of pain medication.      Pauline Aus, PA-C 06/05/15 0046  Dione Booze, MD 06/05/15 [phone]

## 2015-06-05 NOTE — Discharge Instructions (Signed)
Dental Pain  Toothache is pain in or around a tooth. It may get worse with chewing or with cold or heat.   HOME CARE  · Your dentist may use a numbing medicine during treatment. If so, you may need to avoid eating until the medicine wears off. Ask your dentist about this.  · Only take medicine as told by your dentist or doctor.  · Avoid chewing food near the painful tooth until after all treatment is done. Ask your dentist about this.  GET HELP RIGHT AWAY IF:   · The problem gets worse or new problems appear.  · You have a fever.  · There is redness and puffiness (swelling) of the face, jaw, or neck.  · You cannot open your mouth.  · There is pain in the jaw.  · There is very bad pain that is not helped by medicine.  MAKE SURE YOU:   · Understand these instructions.  · Will watch your condition.  · Will get help right away if you are not doing well or get worse.  Document Released: 03/12/2008 Document Revised: 12/17/2011 Document Reviewed: 03/12/2008  [employer]® Patient Information ©2015 [employer]. This information is not intended to replace advice given to you by your health care provider. Make sure you discuss any questions you have with your health care provider.

## 2015-06-06 ENCOUNTER — Encounter: Payer: Self-pay | Admitting: Orthopedic Surgery

## 2015-06-06 ENCOUNTER — Ambulatory Visit (INDEPENDENT_AMBULATORY_CARE_PROVIDER_SITE_OTHER): Payer: Medicaid Other | Admitting: Orthopedic Surgery

## 2015-06-06 VITALS — BP 124/84 | Ht 65.0 in | Wt 170.0 lb

## 2015-06-06 DIAGNOSIS — M25561 Pain in right knee: Secondary | ICD-10-CM

## 2015-06-06 MED ORDER — IBUPROFEN 800 MG PO TABS: 800.0000 mg | ORAL_TABLET | Freq: Three times a day (TID) | ORAL | Status: DC

## 2015-06-06 NOTE — Progress Notes (Signed)
Patient ID: Alyssa Graham, female   DOB: November 16, 1980, 34 y.o.   MRN: [id number]  Chief Complaint  Patient presents with  . Knee Injury    ER follow up on right knee, DOI 05-20-15. REF. [facility]    HPI: 34 year old female presents with back onset of pain in her right knee after stepping off a curb complains of anteromedial knee pain severe of 2 weeks duration described as aching sharp medial pain  ROS denies fever denies warmth or redness around the knee joint  THE PAST FAMILY, MEDICAL, SURGICAL AND SOCIAL HISTORY HAS BEEN REVIEWED AND RECORDED IN PROVIDED SECTIONS OF EPIC.  PHYSICAL EXAM  VITAL SIGNS: BP 124/84 mmHg  Ht  (1.651 m)  Wt 170 lb (77.111 kg)  BMI 28.29 kg/m2   GENERAL well-developed well-nourished endomorphic body habitus  MENTAL STATUS seems anxious to me otherwise normal  MOOD/AFFECT ARE NORMAL   GAIT antalgic right lower extremity gait   EXAM OF THE left knee reveals no abnormalities full range of motion stability and strength  Right knee reveals SKIN normal no redness no blisters no lacerations INSPECTION medial joint line tenderness ROM full passive range of motion STABILITY ligament stable MOTOR GRADE 5/5   Patient appears to be exacerbating symptoms was in near tears from a simple examination she did have some medial joint line tenderness as stated she complained of pain with McMurray's maneuver  VASC 2+  NEURO normal sensation in the right leg  LYMPH not tested   IMAGING STUDIES x-rays were done at the hospital and they were negative for any significant degenerative arthritis   Dx  Encounter Diagnosis  Name Primary?  . Right knee pain Yes      PLAN  recommend additional 4 weeks of anti-inflammatory to complete a six-week course I put her on a home exercise program to satisfy the criteria for MRI she will come back and if she's not better we will get an MRI of her knee

## 2015-06-06 NOTE — Patient Instructions (Signed)
HOME EXERCISES IBUPROFEN SENT TO PHARMACY

## 2015-06-19 ENCOUNTER — Encounter (HOSPITAL_COMMUNITY): Payer: Self-pay | Admitting: Emergency Medicine

## 2015-06-19 ENCOUNTER — Emergency Department (HOSPITAL_COMMUNITY)
Admission: EM | Admit: 2015-06-19 | Discharge: 2015-06-19 | Disposition: A | Discharge status: Home / Self Care | Payer: Medicaid Other | Attending: Emergency Medicine | Admitting: Emergency Medicine

## 2015-06-19 ENCOUNTER — Emergency Department (HOSPITAL_COMMUNITY)
Admission: EM | Admit: 2015-06-19 | Discharge: 2015-06-19 | Disposition: A | Discharge status: Home / Self Care | Payer: Medicaid Other | Source: Home / Self Care

## 2015-06-19 DIAGNOSIS — K088 Other specified disorders of teeth and supporting structures: Secondary | ICD-10-CM | POA: Insufficient documentation

## 2015-06-19 DIAGNOSIS — K0889 Other specified disorders of teeth and supporting structures: Secondary | ICD-10-CM

## 2015-06-19 DIAGNOSIS — K1379 Other lesions of oral mucosa: Secondary | ICD-10-CM | POA: Insufficient documentation

## 2015-06-19 DIAGNOSIS — Z720 Tobacco use: Secondary | ICD-10-CM | POA: Insufficient documentation

## 2015-06-19 DIAGNOSIS — Z79899 Other long term (current) drug therapy: Secondary | ICD-10-CM | POA: Insufficient documentation

## 2015-06-19 MED ORDER — AMOXICILLIN 500 MG PO CAPS: 500.0000 mg | ORAL_CAPSULE | Freq: Three times a day (TID) | ORAL | Status: DC

## 2015-06-19 MED ORDER — AMOXICILLIN 500 MG PO CAPS
500.0000 mg | ORAL_CAPSULE | Freq: Once | ORAL | Status: AC
  Administered 2015-06-19: 500 mg via ORAL
  Filled 2015-06-19: qty 1

## 2015-06-19 NOTE — ED Notes (Signed)
Pt states she had teeth extracted and has followed up with dentist who says it is fine, pt c/o pain and unable to eat or drink anything. Pt has two teeth removed that appear to be well healing. No bleeding or drainage noted.

## 2015-06-19 NOTE — ED Provider Notes (Signed)
CSN: [id number]     Arrival date & time 06/19/15  1942 History  This chart was scribed for non-physician practitioner, Roxy Horseman, PA-C working with Tilden Fossa, MD by Doreatha Martin, ED scribe. This patient was seen in room TR05C/TR05C and the patient's care was started at 8:16 PM    Chief Complaint  Patient presents with  . Dental Pain   The history is provided by the patient. No language interpreter was used.    HPI Comments: Alyssa Graham is a 34 y.o. female s/p upper dental extraction 1 month ago who presents to the Emergency Department with a chief complaint of moderate upper dental pain onset 1 month ago with associated blisters in the mouth onset yesterday. Pt was seen in the ED for the same dental pain on 06/04/2015. She states no relief with tylenol, antibiotics, mouth rinses, oragel. Pt notes that she followed up with her dentist and she received clindamycin.  History reviewed. No pertinent past medical history. Past Surgical History  Procedure Laterality Date  . Abdominal hysterectomy    . Back surgery    . Cholecystectomy    . Tooth extraction     No family history on file. Social History  Substance Use Topics  . Smoking status: Current Every Day Smoker -- 0.50 packs/day    Types: Cigarettes  . Smokeless tobacco: Never Used  . Alcohol Use: No   OB History    Gravida Para Term Preterm AB TAB SAB Ectopic Multiple Living   3 3 3       2      Review of Systems  HENT: Positive for dental problem and mouth sores.    Allergies  Codeine and Toradol  Home Medications   Prior to Admission medications   Medication Sig Start Date End Date Taking? Authorizing Provider  acetaminophen (TYLENOL) 500 MG tablet Take 500-1,500 mg by mouth every 6 (six) hours as needed for mild pain or moderate pain.     Historical Provider, MD  albuterol (PROVENTIL HFA;VENTOLIN HFA) 108 (90 BASE) MCG/ACT inhaler Inhale 2 puffs into the lungs every 6 (six) hours as needed for wheezing or  shortness of breath.    Historical Provider, MD  citalopram (CELEXA) 40 MG tablet Take 40 mg by mouth daily.    Historical Provider, MD  clindamycin (CLEOCIN) 300 MG capsule Take 1 capsule (300 mg total) by mouth 4 (four) times daily. For 7 days 06/05/15   Tammy Triplett, PA-C  dexamethasone (DECADRON) 4 MG tablet Take 1 tablet (4 mg total) by mouth 2 (two) times daily with a meal. Patient not taking: Reported on 05/20/2015 05/01/15   Ivery Quale, PA-C  estrogens, conjugated, (PREMARIN) 1.25 MG tablet Take 1.25 mg by mouth at bedtime.     Historical Provider, MD  HYDROcodone-acetaminophen (NORCO/VICODIN) 5-325 MG per tablet Take one tab po q 4-6 hrs prn pain 06/05/15   Tammy Triplett, PA-C  ibuprofen (ADVIL,MOTRIN) 800 MG tablet Take 1 tablet (800 mg total) by mouth 3 (three) times daily. 06/06/15   Vickki Hearing, MD  methocarbamol (ROBAXIN) 500 MG tablet Take 1 tablet (500 mg total) by mouth 3 (three) times daily. Patient not taking: Reported on 05/20/2015 05/01/15   Ivery Quale, PA-C  naproxen (NAPROSYN) 500 MG tablet Take 1 tablet (500 mg total) by mouth 2 (two) times daily. 05/20/15   Burgess Amor, PA-C  oxyCODONE-acetaminophen (PERCOCET/ROXICET) 5-325 MG per tablet Take 1 tablet by mouth every 4 (four) hours as needed. 05/20/15   Burgess Amor,  PA-C   BP 134/71 mmHg  Pulse 80  Temp(Src) 98.2 F (36.8 C) (Oral)  Resp 16  Ht  (1.651 m)  Wt 180 lb (81.647 kg)  BMI 29.95 kg/m2  SpO2 99% Physical Exam  Constitutional: She is oriented to person, place, and time. She appears well-developed and well-nourished.  HENT:  Head: Normocephalic and atraumatic.  Mouth/Throat:    Poor dentition throughout.  Affected tooth as diagrammed.  No signs of peritonsillar or tonsillar abscess.  No signs of gingival abscess. Oropharynx is clear and without exudates.  Uvula is midline.  Airway is intact. No signs of Ludwig's angina with palpation of oral and sublingual mucosa. 2 small aphthous ulcers, no other  obvious lesions   Eyes: Conjunctivae and EOM are normal. Pupils are equal, round, and reactive to light.  Neck: Normal range of motion. Neck supple.  Cardiovascular: Normal rate.   Pulmonary/Chest: Effort normal. No respiratory distress.  Abdominal: She exhibits no distension.  Musculoskeletal: Normal range of motion.  Neurological: She is alert and oriented to person, place, and time.  Skin: Skin is warm and dry.  Psychiatric: She has a normal mood and affect. Her behavior is normal. Judgment and thought content normal.  Nursing note and vitals reviewed.   ED Course  Procedures (including critical care time) DIAGNOSTIC STUDIES: Oxygen Saturation is 99% on RA, normal by my interpretation.    COORDINATION OF CARE: 8:20 PM Discussed treatment plan with pt at bedside and pt agreed to plan.     MDM   Final diagnoses:  Pain, dental    Patient with toothache.  No gross abscess.  Exam unconcerning for Ludwig's angina or spread of infection.  Will treat with penicillin and OTC pain medicine.  Urged patient to follow-up with dentist.    I personally performed the services described in this documentation, which was scribed in my presence. The recorded information has been reviewed and is accurate.    Roxy Horseman, PA-C 06/19/15 2024  Tilden Fossa, MD 06/20/15 [phone]

## 2015-06-19 NOTE — Discharge Instructions (Signed)

## 2015-06-19 NOTE — ED Notes (Signed)
Pt reports had two teeth extracted x1 month ago. Pt reports sites will not heal. Pt reports seen for same in the past and reports has been px abx several times. Pt denies any improvement and reports tongue swelling and intermittent oral blisters. Airway patent. Mild redness noted to mouth and throat. nad noted.

## 2015-06-19 NOTE — ED Notes (Signed)
Pt LWBS per registration staff.

## 2015-06-27 ENCOUNTER — Encounter (HOSPITAL_COMMUNITY): Payer: Self-pay | Admitting: Emergency Medicine

## 2015-06-27 ENCOUNTER — Emergency Department (HOSPITAL_COMMUNITY)
Admission: EM | Admit: 2015-06-27 | Discharge: 2015-06-27 | Disposition: A | Discharge status: Home / Self Care | Payer: Medicaid Other | Attending: Emergency Medicine | Admitting: Emergency Medicine

## 2015-06-27 DIAGNOSIS — J069 Acute upper respiratory infection, unspecified: Secondary | ICD-10-CM

## 2015-06-27 DIAGNOSIS — Z79899 Other long term (current) drug therapy: Secondary | ICD-10-CM | POA: Insufficient documentation

## 2015-06-27 DIAGNOSIS — J029 Acute pharyngitis, unspecified: Secondary | ICD-10-CM | POA: Diagnosis present

## 2015-06-27 DIAGNOSIS — Z720 Tobacco use: Secondary | ICD-10-CM | POA: Insufficient documentation

## 2015-06-27 DIAGNOSIS — H9203 Otalgia, bilateral: Secondary | ICD-10-CM | POA: Diagnosis not present

## 2015-06-27 MED ORDER — AZITHROMYCIN 250 MG PO TABS: ORAL_TABLET | ORAL | Status: DC

## 2015-06-27 MED ORDER — HYDROCODONE-HOMATROPINE 5-1.5 MG/5ML PO SYRP: 5.0000 mL | ORAL_SOLUTION | Freq: Four times a day (QID) | ORAL | Status: DC | PRN

## 2015-06-27 MED ORDER — DEXAMETHASONE 4 MG PO TABS: 4.0000 mg | ORAL_TABLET | Freq: Two times a day (BID) | ORAL | Status: DC

## 2015-06-27 NOTE — Discharge Instructions (Signed)
Upper Respiratory Infection, Adult An upper respiratory infection (URI) is also sometimes known as the common cold. The upper respiratory tract includes the nose, sinuses, throat, trachea, and bronchi. Bronchi are the airways leading to the lungs. Most people improve within 1 week, but symptoms can last up to 2 weeks. A residual cough may last even longer.  CAUSES Many different viruses can infect the tissues lining the upper respiratory tract. The tissues become irritated and inflamed and often become very moist. Mucus production is also common. A cold is contagious. You can easily spread the virus to others by oral contact. This includes kissing, sharing a glass, coughing, or sneezing. Touching your mouth or nose and then touching a surface, which is then touched by another person, can also spread the virus. SYMPTOMS  Symptoms typically develop 1 to 3 days after you come in contact with a cold virus. Symptoms vary from person to person. They may include:  Runny nose.  Sneezing.  Nasal congestion.  Sinus irritation.  Sore throat.  Loss of voice (laryngitis).  Cough.  Fatigue.  Muscle aches.  Loss of appetite.  Headache.  Low-grade fever. DIAGNOSIS  You might diagnose your own cold based on familiar symptoms, since most people get a cold 2 to 3 times a year. Your caregiver can confirm this based on your exam. Most importantly, your caregiver can check that your symptoms are not due to another disease such as strep throat, sinusitis, pneumonia, asthma, or epiglottitis. Blood tests, throat tests, and X-rays are not necessary to diagnose a common cold, but they may sometimes be helpful in excluding other more serious diseases. Your caregiver will decide if any further tests are required. RISKS AND COMPLICATIONS  You may be at risk for a more severe case of the common cold if you smoke cigarettes, have chronic heart disease (such as heart failure) or lung disease (such as asthma), or if  you have a weakened immune system. The very young and very old are also at risk for more serious infections. Bacterial sinusitis, middle ear infections, and bacterial pneumonia can complicate the common cold. The common cold can worsen asthma and chronic obstructive pulmonary disease (COPD). Sometimes, these complications can require emergency medical care and may be life-threatening. PREVENTION  The best way to protect against getting a cold is to practice good hygiene. Avoid oral or hand contact with people with cold symptoms. Wash your hands often if contact occurs. There is no clear evidence that vitamin C, vitamin E, echinacea, or exercise reduces the chance of developing a cold. However, it is always recommended to get plenty of rest and practice good nutrition. TREATMENT  Treatment is directed at relieving symptoms. There is no cure. Antibiotics are not effective, because the infection is caused by a virus, not by bacteria. Treatment may include:  Increased fluid intake. Sports drinks offer valuable electrolytes, sugars, and fluids.  Breathing heated mist or steam (vaporizer or shower).  Eating chicken soup or other clear broths, and maintaining good nutrition.  Getting plenty of rest.  Using gargles or lozenges for comfort.  Controlling fevers with ibuprofen or acetaminophen as directed by your caregiver.  Increasing usage of your inhaler if you have asthma. Zinc gel and zinc lozenges, taken in the first 24 hours of the common cold, can shorten the duration and lessen the severity of symptoms. Pain medicines may help with fever, muscle aches, and throat pain. A variety of non-prescription medicines are available to treat congestion and runny nose. Your caregiver   can make recommendations and may suggest nasal or lung inhalers for other symptoms.  HOME CARE INSTRUCTIONS   Only take over-the-counter or prescription medicines for pain, discomfort, or fever as directed by your  caregiver.  Use a warm mist humidifier or inhale steam from a shower to increase air moisture. This may keep secretions moist and make it easier to breathe.  Drink enough water and fluids to keep your urine clear or pale yellow.  Rest as needed.  Return to work when your temperature has returned to normal or as your caregiver advises. You may need to stay home longer to avoid infecting others. You can also use a face mask and careful hand washing to prevent spread of the virus. SEEK MEDICAL CARE IF:   After the first few days, you feel you are getting worse rather than better.  You need your caregiver's advice about medicines to control symptoms.  You develop chills, worsening shortness of breath, or brown or red sputum. These may be signs of pneumonia.  You develop yellow or brown nasal discharge or pain in the face, especially when you bend forward. These may be signs of sinusitis.  You develop a fever, swollen neck glands, pain with swallowing, or white areas in the back of your throat. These may be signs of strep throat. SEEK IMMEDIATE MEDICAL CARE IF:   You have a fever.  You develop severe or persistent headache, ear pain, sinus pain, or chest pain.  You develop wheezing, a prolonged cough, cough up blood, or have a change in your usual mucus (if you have chronic lung disease).  You develop sore muscles or a stiff neck. Document Released: 03/20/2001 Document Revised: 12/17/2011 Document Reviewed: 12/30/2013 Exit[employer] Patient Information 2015 [employer]. This information is not intended to replace advice given to you by your health care provider. Make sure you discuss any questions you have with your health care provider.  

## 2015-06-27 NOTE — ED Provider Notes (Signed)
CSN: [id number]     Arrival date & time 06/27/15  1201 History  This chart was scribed for non-physician practitioner, Ivery Quale, PA-C working with Linwood Dibbles, MD by Gwenyth Ober, ED scribe. This patient was seen in room APFT20/APFT20 and the patient's care was started at 1:25 PM   Chief Complaint  Patient presents with  . Cough  . Sore Throat   Patient is a 34 y.o. female presenting with pharyngitis. The history is provided by the patient. No language interpreter was used.  Sore Throat This is a new problem. The current episode started more than 2 days ago. The problem occurs constantly. The problem has not changed since onset.Treatments tried: OTC cough suppressant. The treatment provided no relief.    HPI Comments: Alyssa Graham is a 34 y.o. female who presents to the Emergency Department complaining of constant, moderate sore throat that started 3 days ago. She states nasal congestion, bilateral ear pain and cough as associated symptoms. Pt has tried OTC suppressant with no relief. She has two children at home with similar symptoms. Pt denies fever.   History reviewed. No pertinent past medical history. Past Surgical History  Procedure Laterality Date  . Abdominal hysterectomy    . Back surgery    . Cholecystectomy    . Tooth extraction     History reviewed. No pertinent family history. Social History  Substance Use Topics  . Smoking status: Current Every Day Smoker -- 0.50 packs/day    Types: Cigarettes  . Smokeless tobacco: Never Used  . Alcohol Use: No   OB History    Gravida Para Term Preterm AB TAB SAB Ectopic Multiple Living   Review of Systems  Constitutional: Negative for fever.  HENT: Positive for congestion, ear pain and sore throat.   Respiratory: Positive for cough.   All other systems reviewed and are negative.  Allergies  Codeine and Toradol  Home Medications   Prior to Admission medications   Medication Sig Start Date End  Date Taking? Authorizing Provider  acetaminophen (TYLENOL) 500 MG tablet Take 500-1,500 mg by mouth every 6 (six) hours as needed for mild pain or moderate pain.    Yes Historical Provider, MD  albuterol (PROVENTIL HFA;VENTOLIN HFA) 108 (90 BASE) MCG/ACT inhaler Inhale 2 puffs into the lungs every 6 (six) hours as needed for wheezing or shortness of breath.   Yes Historical Provider, MD  citalopram (CELEXA) 40 MG tablet Take 40 mg by mouth daily.   Yes Historical Provider, MD  estrogens, conjugated, (PREMARIN) 1.25 MG tablet Take 1.25 mg by mouth at bedtime.    Yes Historical Provider, MD  naproxen (NAPROSYN) 500 MG tablet Take 1 tablet (500 mg total) by mouth 2 (two) times daily. 05/20/15  Yes Burgess Amor, PA-C  oxyCODONE-acetaminophen (PERCOCET/ROXICET) 5-325 MG per tablet Take 1 tablet by mouth every 4 (four) hours as needed. 05/20/15  Yes Raynelle Fanning Idol, PA-C  amoxicillin (AMOXIL) 500 MG capsule Take 1 capsule (500 mg total) by mouth 3 (three) times daily. Patient not taking: Reported on 06/27/2015 06/19/15   Roxy Horseman, PA-C  clindamycin (CLEOCIN) 300 MG capsule Take 1 capsule (300 mg total) by mouth 4 (four) times daily. For 7 days Patient not taking: Reported on 06/27/2015 06/05/15   Tammy Triplett, PA-C  dexamethasone (DECADRON) 4 MG tablet Take 1 tablet (4 mg total) by mouth 2 (two) times daily with a meal. Patient not taking: Reported  on 05/20/2015 05/01/15   Ivery Quale, PA-C  HYDROcodone-acetaminophen (NORCO/VICODIN) 5-325 MG per tablet Take one tab po q 4-6 hrs prn pain Patient not taking: Reported on 06/27/2015 06/05/15   Tammy Triplett, PA-C  ibuprofen (ADVIL,MOTRIN) 800 MG tablet Take 1 tablet (800 mg total) by mouth 3 (three) times daily. Patient not taking: Reported on 06/27/2015 06/06/15   Vickki Hearing, MD  methocarbamol (ROBAXIN) 500 MG tablet Take 1 tablet (500 mg total) by mouth 3 (three) times daily. Patient not taking: Reported on 05/20/2015 05/01/15   Ivery Quale, PA-C   BP  107/50 mmHg  Pulse 72  Temp(Src) 97.9 F (36.6 C) (Oral)  Resp 18  Ht  (1.651 m)  Wt 180 lb (81.647 kg)  BMI 29.95 kg/m2  SpO2 100% Physical Exam  Constitutional: She appears well-developed and well-nourished. No distress.  HENT:  Head: Normocephalic and atraumatic.  Right Ear: Tympanic membrane and external ear normal.  Scar tissue of left TM but no bulging of the left or right TM Mild swelling of the uvula Uvula midline No exudate appreciated  Eyes: Conjunctivae and EOM are normal.  Neck: Normal range of motion. Neck supple. No tracheal deviation present.  Full ROM No rigidity  Cardiovascular: Normal rate, regular rhythm and normal heart sounds.  Exam reveals no gallop and no friction rub.   Pulmonary/Chest: Effort normal and breath sounds normal. No respiratory distress. She has no wheezes. She has no rales.  Lungs clear  Musculoskeletal:  Capillary refill less than 2 s  Skin: Skin is warm and dry. No rash noted.  No rash in the palms  Psychiatric: She has a normal mood and affect. Her behavior is normal.  Nursing note and vitals reviewed.   ED Course  Discussed with the patient that these findings are consistent with an upper respiratory infection. Suggested the Tylenol, ibuprofen, fluids, D congesting medications, and time. The patient is insistent that she gets this almost once a year. She is further insistent that the only thing that fixes this is an antibody neck and a narcotic cough medication. Discussed the findings on examination with the patient, however she is insistent that this is what she desired to improve her condition.  Prescription for Hycodan cough medication and Zithromax Z-Pak given to the patient.   Procedures  DIAGNOSTIC STUDIES: Oxygen Saturation is 100% on RA, normal by my interpretation.    COORDINATION OF CARE: 1:29 PM Discussed treatment plan with pt which includes Dimetapp and Ibuprofen. Advised pt to increase fluid intake. Pt agreed to  plan.  MDM  Vital signs are well within normal limits. The patient speaks in clear complete sentences. There is no unusual rash noted. There are 2 other people in the home who are ill. The patient is given instructions to wash hands frequently to increase fluids, and to use medications as prescribed.    Final diagnoses:  None    *I have reviewed nursing notes, vital signs, and all appropriate lab and imaging results for this patient.**  **I personally performed the services described in this documentation, which was scribed in my presence. The recorded information has been reviewed and is accurate.Ivery Quale, PA-C 06/27/15 1408  Linwood Dibbles, MD 06/27/15 1556

## 2015-06-27 NOTE — ED Notes (Signed)
Pt reports hacking cough and sore throat since Sat.

## 2015-06-30 ENCOUNTER — Emergency Department (HOSPITAL_COMMUNITY)
Admission: EM | Admit: 2015-06-30 | Discharge: 2015-06-30 | Disposition: A | Discharge status: Home / Self Care | Payer: Medicaid Other | Attending: Emergency Medicine | Admitting: Emergency Medicine

## 2015-06-30 ENCOUNTER — Encounter (HOSPITAL_COMMUNITY): Payer: Self-pay | Admitting: Emergency Medicine

## 2015-06-30 DIAGNOSIS — Z791 Long term (current) use of non-steroidal anti-inflammatories (NSAID): Secondary | ICD-10-CM | POA: Diagnosis not present

## 2015-06-30 DIAGNOSIS — Z79899 Other long term (current) drug therapy: Secondary | ICD-10-CM | POA: Diagnosis not present

## 2015-06-30 DIAGNOSIS — Z3202 Encounter for pregnancy test, result negative: Secondary | ICD-10-CM | POA: Diagnosis not present

## 2015-06-30 DIAGNOSIS — R1013 Epigastric pain: Secondary | ICD-10-CM | POA: Diagnosis not present

## 2015-06-30 DIAGNOSIS — Z720 Tobacco use: Secondary | ICD-10-CM | POA: Insufficient documentation

## 2015-06-30 DIAGNOSIS — R112 Nausea with vomiting, unspecified: Secondary | ICD-10-CM | POA: Insufficient documentation

## 2015-06-30 DIAGNOSIS — R197 Diarrhea, unspecified: Secondary | ICD-10-CM | POA: Insufficient documentation

## 2015-06-30 LAB — URINALYSIS, ROUTINE W REFLEX MICROSCOPIC
BILIRUBIN URINE: NEGATIVE
GLUCOSE, UA: NEGATIVE mg/dL
KETONES UR: NEGATIVE mg/dL
NITRITE: NEGATIVE
PROTEIN: NEGATIVE mg/dL
Specific Gravity, Urine: 1.024 (ref 1.005–1.030)
Urobilinogen, UA: 0.2 mg/dL (ref 0.0–1.0)
pH: 5 (ref 5.0–8.0)

## 2015-06-30 LAB — URINE MICROSCOPIC-ADD ON

## 2015-06-30 LAB — COMPREHENSIVE METABOLIC PANEL
ALK PHOS: 48 U/L (ref 38–126)
ALT: 17 U/L (ref 14–54)
AST: 23 U/L (ref 15–41)
Albumin: 3.9 g/dL (ref 3.5–5.0)
Anion gap: 8 (ref 5–15)
BILIRUBIN TOTAL: 0.3 mg/dL (ref 0.3–1.2)
BUN: 9 mg/dL (ref 6–20)
CALCIUM: 8.9 mg/dL (ref 8.9–10.3)
CO2: 22 mmol/L (ref 22–32)
CREATININE: 0.7 mg/dL (ref 0.44–1.00)
Chloride: 106 mmol/L (ref 101–111)
Glucose, Bld: 126 mg/dL — ABNORMAL HIGH (ref 65–99)
Potassium: 3.8 mmol/L (ref 3.5–5.1)
Sodium: 136 mmol/L (ref 135–145)
TOTAL PROTEIN: 6.9 g/dL (ref 6.5–8.1)

## 2015-06-30 LAB — CBC
HCT: 40.6 % (ref 36.0–46.0)
Hemoglobin: 13.7 g/dL (ref 12.0–15.0)
MCH: 30.7 pg (ref 26.0–34.0)
MCHC: 33.7 g/dL (ref 30.0–36.0)
MCV: 91 fL (ref 78.0–100.0)
PLATELETS: 223 10*3/uL (ref 150–400)
RBC: 4.46 MIL/uL (ref 3.87–5.11)
RDW: 12.7 % (ref 11.5–15.5)
WBC: 7.2 10*3/uL (ref 4.0–10.5)

## 2015-06-30 LAB — LIPASE, BLOOD: LIPASE: 29 U/L (ref 22–51)

## 2015-06-30 LAB — PREGNANCY, URINE: Preg Test, Ur: NEGATIVE

## 2015-06-30 MED ORDER — FENTANYL CITRATE (PF) 100 MCG/2ML IJ SOLN
25.0000 ug | Freq: Once | INTRAMUSCULAR | Status: AC
  Administered 2015-06-30: 25 ug via INTRAVENOUS
  Filled 2015-06-30: qty 2

## 2015-06-30 MED ORDER — ONDANSETRON 4 MG PO TBDP
4.0000 mg | ORAL_TABLET | Freq: Once | ORAL | Status: AC | PRN
  Administered 2015-06-30: 4 mg via ORAL

## 2015-06-30 MED ORDER — SODIUM CHLORIDE 0.9 % IV SOLN
Freq: Once | INTRAVENOUS | Status: AC
  Administered 2015-06-30: 20:00:00 via INTRAVENOUS

## 2015-06-30 MED ORDER — GI COCKTAIL ~~LOC~~: 30.0000 mL | Freq: Once | ORAL | Status: DC

## 2015-06-30 MED ORDER — ONDANSETRON 4 MG PO TBDP
ORAL_TABLET | ORAL | Status: AC
  Filled 2015-06-30: qty 1

## 2015-06-30 MED ORDER — ONDANSETRON HCL 4 MG PO TABS: 4.0000 mg | ORAL_TABLET | Freq: Four times a day (QID) | ORAL | Status: DC

## 2015-06-30 MED ORDER — FENTANYL CITRATE (PF) 100 MCG/2ML IJ SOLN
50.0000 ug | Freq: Once | INTRAMUSCULAR | Status: AC
  Administered 2015-06-30: 25 ug via INTRAVENOUS
  Filled 2015-06-30: qty 2

## 2015-06-30 MED ORDER — ONDANSETRON HCL 4 MG/2ML IJ SOLN
4.0000 mg | Freq: Once | INTRAMUSCULAR | Status: AC
  Administered 2015-06-30: 4 mg via INTRAVENOUS
  Filled 2015-06-30: qty 2

## 2015-06-30 NOTE — ED Notes (Signed)
33 yof presents to ED with c/o NVD. Onset 2 days ago. Patient states she has thrown up 9 times in the past 24 hours. Patient states she has had over 30 diarrheal episodes in the past 24 hours. Patient states her stomach is swelling. Patient states her abd pain feels like labor pain and rates it a 10/10. Patient no longer has periods due to a hysterectomy.States pain radiates to her back.  Denies being around anyone who has been sick. Does c/o SOB, dizziness. Denies chest pain. States she has been coughing up a little bit of flem. Patient states she was dx with upper respiratory infection at [facility] about a week ago.

## 2015-06-30 NOTE — ED Provider Notes (Signed)
CSN: [id number]     Arrival date & time 06/30/15  1413 History  This chart was scribed for non-physician practitioner Catha Gosselin, PA-C working with Richardean Canal, MD by Lyndel Safe, ED Scribe. This patient was seen in room TR09C/TR09C and the patient's care was started at 7:17 PM.   Chief Complaint  Patient presents with  . Abdominal Pain    NVD. Left sided abd pain.   The history is provided by the patient. No language interpreter was used.   HPI Comments: Alyssa Graham is a 34 y.o. female, with a PShx of abdominal hysterectomy and cholecystectomy, who presents to the Emergency Department complaining of constant, moderate LLQ abdominal pain onset 2 days ago. She associates nausea, vomiting, and multiple episodes of clear, watery diarrhea. She describes streaks of blood on the toilet paper after wiping. Pt reports she is unable to keep anything PO down. The pt was seen 7 days ago at [facility] and given a Z-pack and clindamycin, cough medication, and prednisone for an URI. She has been compliant with all medications. Pt ambulatory with steady gait. Denies EtOH consumption or drug use, recent travel, or recently being outside in the woods. Also denies any fevers, CP or SOB, dysuria, hematuria or urinary frequency.   History reviewed. No pertinent past medical history. Past Surgical History  Procedure Laterality Date  . Abdominal hysterectomy    . Back surgery    . Cholecystectomy    . Tooth extraction     No family history on file. Social History  Substance Use Topics  . Smoking status: Current Every Day Smoker -- 0.50 packs/day    Types: Cigarettes  . Smokeless tobacco: Never Used  . Alcohol Use: No   OB History    Gravida Para Term Preterm AB TAB SAB Ectopic Multiple Living   Review of Systems  Constitutional: Negative for fever.  Respiratory: Negative for shortness of breath.   Cardiovascular: Negative for chest pain.  Gastrointestinal: Positive  for nausea, vomiting, abdominal pain and diarrhea.  Genitourinary: Negative for dysuria, frequency and hematuria.   Allergies  Codeine and Toradol  Home Medications   Prior to Admission medications   Medication Sig Start Date End Date Taking? Authorizing Provider  acetaminophen (TYLENOL) 500 MG tablet Take 500-1,500 mg by mouth every 6 (six) hours as needed for mild pain or moderate pain.     Historical Provider, MD  albuterol (PROVENTIL HFA;VENTOLIN HFA) 108 (90 BASE) MCG/ACT inhaler Inhale 2 puffs into the lungs every 6 (six) hours as needed for wheezing or shortness of breath.    Historical Provider, MD  amoxicillin (AMOXIL) 500 MG capsule Take 1 capsule (500 mg total) by mouth 3 (three) times daily. Patient not taking: Reported on 06/27/2015 06/19/15   Roxy Horseman, PA-C  azithromycin (ZITHROMAX) 250 MG tablet 2 tabs day one, then 1 tab daily. 06/27/15   Ivery Quale, PA-C  citalopram (CELEXA) 40 MG tablet Take 40 mg by mouth daily.    Historical Provider, MD  clindamycin (CLEOCIN) 300 MG capsule Take 1 capsule (300 mg total) by mouth 4 (four) times daily. For 7 days Patient not taking: Reported on 06/27/2015 06/05/15   Tammy Triplett, PA-C  dexamethasone (DECADRON) 4 MG tablet Take 1 tablet (4 mg total) by mouth 2 (two) times daily with a meal. 06/27/15   Ivery Quale, PA-C  estrogens, conjugated, (PREMARIN) 1.25 MG tablet Take 1.25 mg by  mouth at bedtime.     Historical Provider, MD  HYDROcodone-acetaminophen (NORCO/VICODIN) 5-325 MG per tablet Take one tab po q 4-6 hrs prn pain Patient not taking: Reported on 06/27/2015 06/05/15   Tammy Triplett, PA-C  HYDROcodone-homatropine (HYCODAN) 5-1.5 MG/5ML syrup Take 5 mLs by mouth every 6 (six) hours as needed. 06/27/15   Ivery Quale, PA-C  ibuprofen (ADVIL,MOTRIN) 800 MG tablet Take 1 tablet (800 mg total) by mouth 3 (three) times daily. Patient not taking: Reported on 06/27/2015 06/06/15   Vickki Hearing, MD  methocarbamol (ROBAXIN) 500 MG  tablet Take 1 tablet (500 mg total) by mouth 3 (three) times daily. Patient not taking: Reported on 05/20/2015 05/01/15   Ivery Quale, PA-C  naproxen (NAPROSYN) 500 MG tablet Take 1 tablet (500 mg total) by mouth 2 (two) times daily. 05/20/15   Burgess Amor, PA-C  ondansetron (ZOFRAN) 4 MG tablet Take 1 tablet (4 mg total) by mouth every 6 (six) hours. 06/30/15   Hanna Patel-Mills, PA-C  oxyCODONE-acetaminophen (PERCOCET/ROXICET) 5-325 MG per tablet Take 1 tablet by mouth every 4 (four) hours as needed. 05/20/15   Burgess Amor, PA-C   BP 109/68 mmHg  Pulse 75  Temp(Src) 98.3 F (36.8 C) (Oral)  Resp 16  Ht  (1.651 m)  Wt 181 lb (82.101 kg)  BMI 30.12 kg/m2  SpO2 96% Physical Exam  Constitutional: She is oriented to person, place, and time. She appears well-developed and well-nourished. No distress.  HENT:  Head: Normocephalic.  Eyes: Conjunctivae are normal.  Neck: Normal range of motion. Neck supple.  Cardiovascular: Normal rate.   Pulmonary/Chest: Effort normal. No respiratory distress.  Musculoskeletal: Normal range of motion.  Neurological: She is alert and oriented to person, place, and time. Coordination normal.  Skin: Skin is warm.  Psychiatric: She has a normal mood and affect. Her behavior is normal.  Nursing note and vitals reviewed.   ED Course  Procedures  DIAGNOSTIC STUDIES: Oxygen Saturation is 96% on RA, adequate by my interpretation.    COORDINATION OF CARE: 7:24 PM Discussed treatment plan with pt at bedside and pt agreed to plan.   Labs Review Labs Reviewed  COMPREHENSIVE METABOLIC PANEL - Abnormal; Notable for the following:    Glucose, Bld 126 (*)    All other components within normal limits  URINALYSIS, ROUTINE W REFLEX MICROSCOPIC (NOT AT [facility]) - Abnormal; Notable for the following:    APPearance CLOUDY (*)    Hgb urine dipstick SMALL (*)    Leukocytes, UA SMALL (*)    All other components within normal limits  URINE MICROSCOPIC-ADD ON - Abnormal;  Notable for the following:    Squamous Epithelial / LPF MANY (*)    Bacteria, UA MANY (*)    All other components within normal limits  LIPASE, BLOOD  CBC  PREGNANCY, URINE    Imaging Review No results found. I have personally reviewed and evaluated these lab results as part of my medical decision-making.   EKG Interpretation None      MDM   Final diagnoses:  Epigastric pain  Patient presents for abdominal pain, nausea, and diarrhea. She is well-appearing and in no acute distress. Her labs are unremarkable. She is mildly hypotensive. She was given fluids and fentanyl. She refused a GI cocktail for epigastric pain. She is tolerating by mouth fluids. Medications  ondansetron (ZOFRAN-ODT) 4 MG disintegrating tablet (not administered)  ondansetron (ZOFRAN-ODT) disintegrating tablet 4 mg (4 mg Oral Given 06/30/15 1523)  0.9 %  sodium chloride  infusion ( Intravenous Stopped 06/30/15 2200)  ondansetron (ZOFRAN) injection 4 mg (4 mg Intravenous Given 06/30/15 2003)  fentaNYL (SUBLIMAZE) injection 50 mcg (25 mcg Intravenous Given 06/30/15 2046)  fentaNYL (SUBLIMAZE) injection 25 mcg (25 mcg Intravenous Given 06/30/15 2208)  She states she is currently taking clindamycin for a dental infection. I discussed discontinuing the medication due to diarrhea. I gave her return precautions as well as follow-up. Patient verbally agrees with the plan. Her blood pressure improved during the duration of her ED stay. Filed Vitals:   06/30/15 2230  BP: 129/83  Pulse: 71  Temp:   Resp: 14  Rx: Zofran I personally performed the services described in this documentation, which was scribed in my presence. The recorded information has been reviewed and is accurate.    Catha Gosselin, PA-C 06/30/15 2308  Richardean Canal, MD 06/30/15 [phone]

## 2015-06-30 NOTE — Discharge Instructions (Signed)
Abdominal Pain Return to the ED for fever, increased abdominal pain, or inability to tolerate fluids. Many things can cause abdominal pain. Usually, abdominal pain is not caused by a disease and will improve without treatment. It can often be observed and treated at home. Your health care provider will do a physical exam and possibly order blood tests and X-rays to help determine the seriousness of your pain. However, in many cases, more time must pass before a clear cause of the pain can be found. Before that point, your health care provider may not know if you need more testing or further treatment. HOME CARE INSTRUCTIONS  Monitor your abdominal pain for any changes. The following actions may help to alleviate any discomfort you are experiencing:  Only take over-the-counter or prescription medicines as directed by your health care provider.  Do not take laxatives unless directed to do so by your health care provider.  Try a clear liquid diet (broth, tea, or water) as directed by your health care provider. Slowly move to a bland diet as tolerated. SEEK MEDICAL CARE IF:  You have unexplained abdominal pain.  You have abdominal pain associated with nausea or diarrhea.  You have pain when you urinate or have a bowel movement.  You experience abdominal pain that wakes you in the night.  You have abdominal pain that is worsened or improved by eating food.  You have abdominal pain that is worsened with eating fatty foods.  You have a fever. SEEK IMMEDIATE MEDICAL CARE IF:   Your pain does not go away within 2 hours.  You keep throwing up (vomiting).  Your pain is felt only in portions of the abdomen, such as the right side or the left lower portion of the abdomen.  You pass bloody or black tarry stools. MAKE SURE YOU:  Understand these instructions.   Will watch your condition.   Will get help right away if you are not doing well or get worse.  Document Released: 07/04/2005  Document Revised: 09/29/2013 Document Reviewed: 06/03/2013 [facility] Patient Information 2015 [place], [place]. This information is not intended to replace advice given to you by your health care provider. Make sure you discuss any questions you have with your health care provider.

## 2015-07-04 ENCOUNTER — Ambulatory Visit: Payer: Medicaid Other | Admitting: Orthopedic Surgery

## 2015-07-04 ENCOUNTER — Encounter: Payer: Self-pay | Admitting: Orthopedic Surgery

## 2015-08-29 ENCOUNTER — Emergency Department (HOSPITAL_COMMUNITY)
Admission: EM | Admit: 2015-08-29 | Discharge: 2015-08-29 | Disposition: A | Discharge status: Home / Self Care | Payer: Medicaid Other | Attending: Emergency Medicine | Admitting: Emergency Medicine

## 2015-08-29 ENCOUNTER — Encounter (HOSPITAL_COMMUNITY): Payer: Self-pay | Admitting: *Deleted

## 2015-08-29 ENCOUNTER — Emergency Department (HOSPITAL_COMMUNITY): Payer: Medicaid Other

## 2015-08-29 DIAGNOSIS — M549 Dorsalgia, unspecified: Secondary | ICD-10-CM | POA: Diagnosis not present

## 2015-08-29 DIAGNOSIS — F17210 Nicotine dependence, cigarettes, uncomplicated: Secondary | ICD-10-CM | POA: Diagnosis not present

## 2015-08-29 DIAGNOSIS — H66013 Acute suppurative otitis media with spontaneous rupture of ear drum, bilateral: Secondary | ICD-10-CM | POA: Insufficient documentation

## 2015-08-29 DIAGNOSIS — Z79899 Other long term (current) drug therapy: Secondary | ICD-10-CM | POA: Diagnosis not present

## 2015-08-29 DIAGNOSIS — J209 Acute bronchitis, unspecified: Secondary | ICD-10-CM | POA: Diagnosis not present

## 2015-08-29 DIAGNOSIS — R05 Cough: Secondary | ICD-10-CM | POA: Diagnosis present

## 2015-08-29 DIAGNOSIS — J40 Bronchitis, not specified as acute or chronic: Secondary | ICD-10-CM

## 2015-08-29 MED ORDER — IBUPROFEN 400 MG PO TABS
600.0000 mg | ORAL_TABLET | Freq: Once | ORAL | Status: AC
  Administered 2015-08-29: 600 mg via ORAL
  Filled 2015-08-29: qty 2

## 2015-08-29 MED ORDER — OXYCODONE-ACETAMINOPHEN 5-325 MG PO TABS
1.0000 | ORAL_TABLET | Freq: Once | ORAL | Status: AC
  Administered 2015-08-29: 1 via ORAL
  Filled 2015-08-29: qty 1

## 2015-08-29 MED ORDER — AMOXICILLIN 500 MG PO CAPS: 500.0000 mg | ORAL_CAPSULE | Freq: Three times a day (TID) | ORAL | Status: DC

## 2015-08-29 MED ORDER — TRAMADOL HCL 50 MG PO TABS: 50.0000 mg | ORAL_TABLET | Freq: Four times a day (QID) | ORAL | Status: DC | PRN

## 2015-08-29 NOTE — Discharge Instructions (Signed)
Otitis Media With Effusion °Otitis media with effusion is the presence of fluid in the middle ear. This is a common problem in children, which often follows ear infections. It may be present for weeks or longer after the infection. Unlike an acute ear infection, otitis media with effusion refers only to fluid behind the ear drum and not infection. Children with repeated ear and sinus infections and allergy problems are the most likely to get otitis media with effusion. °CAUSES  °The most frequent cause of the fluid buildup is dysfunction of the eustachian tubes. These are the tubes that drain fluid in the ears to the back of the nose (nasopharynx). °SYMPTOMS  °· The main symptom of this condition is hearing loss. As a result, you or your child may: °¨ Listen to the TV at a loud volume. °¨ Not respond to questions. °¨ Ask "what" often when spoken to. °¨ Mistake or confuse one sound or word for another. °· There may be a sensation of fullness or pressure but usually not pain. °DIAGNOSIS  °· Your health care provider will diagnose this condition by examining you or your child's ears. °· Your health care provider may test the pressure in you or your child's ear with a tympanometer. °· A hearing test may be conducted if the problem persists. °TREATMENT  °· Treatment depends on the duration and the effects of the effusion. °· Antibiotics, decongestants, nose drops, and cortisone-type drugs (tablets or nasal spray) may not be helpful. °· Children with persistent ear effusions may have delayed language or behavioral problems. Children at risk for developmental delays in hearing, learning, and speech may require referral to a specialist earlier than children not at risk. °· You or your child's health care provider may suggest a referral to an ear, nose, and throat surgeon for treatment. The following may help restore normal hearing: °¨ Drainage of fluid. °¨ Placement of ear tubes (tympanostomy tubes). °¨ Removal of adenoids  (adenoidectomy). °HOME CARE INSTRUCTIONS  °· Avoid secondhand smoke. °· Infants who are breastfed are less likely to have this condition. °· Avoid feeding infants while they are lying flat. °· Avoid known environmental allergens. °· Avoid people who are sick. °SEEK MEDICAL CARE IF:  °· Hearing is not better in 3 months. °· Hearing is worse. °· Ear pain. °· Drainage from the ear. °· Dizziness. °MAKE SURE YOU:  °· Understand these instructions. °· Will watch your condition. °· Will get help right away if you are not doing well or get worse. °  °This information is not intended to replace advice given to you by your health care provider. Make sure you discuss any questions you have with your health care provider. °  °Document Released: 11/01/2004 Document Revised: 10/15/2014 Document Reviewed: 04/21/2013 °Elsevier Interactive Patient Education ©2016 [employer] °Upper Respiratory Infection, Adult °Most upper respiratory infections (URIs) are a viral infection of the air passages leading to the lungs. A URI affects the nose, throat, and upper air passages. The most common type of URI is nasopharyngitis and is typically referred to as "the common cold." °URIs run their course and usually go away on their own. Most of the time, a URI does not require medical attention, but sometimes a bacterial infection in the upper airways can follow a viral infection. This is called a secondary infection. Sinus and middle ear infections are common types of secondary upper respiratory infections. °Bacterial pneumonia can also complicate a URI. A URI can worsen asthma and chronic obstructive pulmonary disease (  COPD). Sometimes, these complications can require emergency medical care and may be life threatening.  °CAUSES °Almost all URIs are caused by viruses. A virus is a type of germ and can spread from one person to another.  °RISKS FACTORS °You may be at risk for a URI if:  °· You smoke.   °· You have chronic heart or lung  disease. °· You have a weakened defense (immune) system.   °· You are very young or very old.   °· You have nasal allergies or asthma. °· You work in crowded or poorly ventilated areas. °· You work in health care facilities or schools. °SIGNS AND SYMPTOMS  °Symptoms typically develop 2-3 days after you come in contact with a cold virus. Most viral URIs last 7-10 days. However, viral URIs from the influenza virus (flu virus) can last 14-18 days and are typically more severe. Symptoms may include:  °· Runny or stuffy (congested) nose.   °· Sneezing.   °· Cough.   °· Sore throat.   °· Headache.   °· Fatigue.   °· Fever.   °· Loss of appetite.   °· Pain in your forehead, behind your eyes, and over your cheekbones (sinus pain). °· Muscle aches.   °DIAGNOSIS  °Your health care provider may diagnose a URI by: °· Physical exam. °· Tests to check that your symptoms are not due to another condition such as: °¨ Strep throat. °¨ Sinusitis. °¨ Pneumonia. °¨ Asthma. °TREATMENT  °A URI goes away on its own with time. It cannot be cured with medicines, but medicines may be prescribed or recommended to relieve symptoms. Medicines may help: °· Reduce your fever. °· Reduce your cough. °· Relieve nasal congestion. °HOME CARE INSTRUCTIONS  °· Take medicines only as directed by your health care provider.   °· Gargle warm saltwater or take cough drops to comfort your throat as directed by your health care provider. °· Use a warm mist humidifier or inhale steam from a shower to increase air moisture. This may make it easier to breathe. °· Drink enough fluid to keep your urine clear or pale yellow.   °· Eat soups and other clear broths and maintain good nutrition.   °· Rest as needed.   °· Return to work when your temperature has returned to normal or as your health care provider advises. You may need to stay home longer to avoid infecting others. You can also use a face mask and careful hand washing to prevent spread of the  virus. °· Increase the usage of your inhaler if you have asthma.   °· Do not use any tobacco products, including cigarettes, chewing tobacco, or electronic cigarettes. If you need help quitting, ask your health care provider. °PREVENTION  °The best way to protect yourself from getting a cold is to practice good hygiene.  °· Avoid oral or hand contact with people with cold symptoms.   °· Wash your hands often if contact occurs.   °There is no clear evidence that vitamin C, vitamin E, echinacea, or exercise reduces the chance of developing a cold. However, it is always recommended to get plenty of rest, exercise, and practice good nutrition.  °SEEK MEDICAL CARE IF:  °· You are getting worse rather than better.   °· Your symptoms are not controlled by medicine.   °· You have chills. °· You have worsening shortness of breath. °· You have brown or red mucus. °· You have yellow or brown nasal discharge. °· You have pain in your face, especially when you bend forward. °· You have a fever. °· You have swollen neck glands. °·   You have pain while swallowing. °· You have white areas in the back of your throat. °SEEK IMMEDIATE MEDICAL CARE IF:  °· You have severe or persistent: °¨ Headache. °¨ Ear pain. °¨ Sinus pain. °¨ Chest pain. °· You have chronic lung disease and any of the following: °¨ Wheezing. °¨ Prolonged cough. °¨ Coughing up blood. °¨ A change in your usual mucus. °· You have a stiff neck. °· You have changes in your: °¨ Vision. °¨ Hearing. °¨ Thinking. °¨ Mood. °MAKE SURE YOU:  °· Understand these instructions. °· Will watch your condition. °· Will get help right away if you are not doing well or get worse. °  °This information is not intended to replace advice given to you by your health care provider. Make sure you discuss any questions you have with your health care provider. °  °Document Released: 03/20/2001 Document Revised: 02/08/2015 Document Reviewed: 12/30/2013 °Elsevier Interactive Patient Education  ©2016 [employer] ° °

## 2015-08-29 NOTE — ED Notes (Signed)
Patient verbalizes understanding of discharge instructions, prescription medications, home care and follow up care. Patient ambulatory out of department at this time with family member. 

## 2015-08-29 NOTE — ED Provider Notes (Signed)
CSN: [id number]     Arrival date & time 08/29/15  1938 History  By signing my name below, I, Budd PalmerVanessa Prueter, attest that this documentation has been prepared under the direction and in the presence of Raeford RazorStephen Stefanny Pieri, MD. Electronically Signed: Budd PalmerVanessa Prueter, ED Scribe. 08/29/2015. 9:48 PM.     Chief Complaint  Patient presents with  . Cough   The history is provided by the patient. No language interpreter was used.   HPI Comments: Alvy BealBrittany R Vanderford is a 34 y.o. female smoker at 0.5 ppd who presents to the Emergency Department complaining of gradually worsening, non-productive cough onset 1 week ago. She reports associated bilateral ear pain, left ear drainage, back pain, sore throat, voice change, and central pressure-like constant CP that began intermittently, but has been constant for the past few days. She notes exacerbation of the CP with coughing and deep breathing. She notes her mother had PNA recently. She states she has been taking robitussin with no relief. She denies any other medical issues. Pt denies a PMHX of frequent ear infections. She also denies leg swelling.  Pt is allergic to codeine and Toradol.  History reviewed. No pertinent past medical history. Past Surgical History  Procedure Laterality Date  . Abdominal hysterectomy    . Back surgery    . Cholecystectomy    . Tooth extraction     No family history on file. Social History  Substance Use Topics  . Smoking status: Current Every Day Smoker -- 0.50 packs/day    Types: Cigarettes  . Smokeless tobacco: Never Used  . Alcohol Use: No   OB History    Gravida Para Term Preterm AB TAB SAB Ectopic Multiple Living   3 3 3       2      Review of Systems  HENT: Positive for ear discharge, ear pain, sore throat and voice change.   Respiratory: Positive for cough.   Cardiovascular: Positive for chest pain. Negative for leg swelling.  Musculoskeletal: Positive for back pain.  All other systems reviewed and are  negative.   Allergies  Codeine and Toradol  Home Medications   Prior to Admission medications   Medication Sig Start Date End Date Taking? Authorizing Provider  acetaminophen (TYLENOL) 500 MG tablet Take 500-1,500 mg by mouth every 6 (six) hours as needed for mild pain or moderate pain.    Yes Historical Provider, MD  albuterol (PROVENTIL HFA;VENTOLIN HFA) 108 (90 BASE) MCG/ACT inhaler Inhale 2 puffs into the lungs every 6 (six) hours as needed for wheezing or shortness of breath.   Yes Historical Provider, MD  Benzonatate (TESSALON PO) Take 1 capsule by mouth daily as needed (for cough).   Yes Historical Provider, MD  Chlorpheniramine-DM (COUGH & COLD PO) Take 1-2 tablets by mouth daily as needed (for cough).   Yes Historical Provider, MD  Chlorpheniramine-DM (COUGH & COLD PO) Take 5-10 mLs by mouth daily as needed (for cough).   Yes Historical Provider, MD  citalopram (CELEXA) 40 MG tablet Take 40 mg by mouth daily.   Yes Historical Provider, MD  estrogens, conjugated, (PREMARIN) 1.25 MG tablet Take 1.25 mg by mouth at bedtime.    Yes Historical Provider, MD  LORazepam (ATIVAN) 1 MG tablet Take 1 mg by mouth 3 (three) times daily. 08/17/15  Yes Historical Provider, MD  medroxyPROGESTERone (DEPO-PROVERA) 150 MG/ML injection Inject 150 mg into the muscle every 3 (three) months.   Yes Historical Provider, MD  amoxicillin (AMOXIL) 500 MG capsule Take 1  capsule (500 mg total) by mouth 3 (three) times daily. Patient not taking: Reported on 06/27/2015 06/19/15   Roxy Horseman, PA-C  azithromycin (ZITHROMAX) 250 MG tablet 2 tabs day one, then 1 tab daily. Patient not taking: Reported on 08/29/2015 06/27/15   Ivery Quale, PA-C  clindamycin (CLEOCIN) 300 MG capsule Take 1 capsule (300 mg total) by mouth 4 (four) times daily. For 7 days Patient not taking: Reported on 06/27/2015 06/05/15   Tammy Triplett, PA-C  dexamethasone (DECADRON) 4 MG tablet Take 1 tablet (4 mg total) by mouth 2 (two) times daily  with a meal. Patient not taking: Reported on 08/29/2015 06/27/15   Ivery Quale, PA-C  HYDROcodone-acetaminophen (NORCO/VICODIN) 5-325 MG per tablet Take one tab po q 4-6 hrs prn pain Patient not taking: Reported on 06/27/2015 06/05/15   Tammy Triplett, PA-C  HYDROcodone-homatropine (HYCODAN) 5-1.5 MG/5ML syrup Take 5 mLs by mouth every 6 (six) hours as needed. Patient not taking: Reported on 08/29/2015 06/27/15   Ivery Quale, PA-C  ibuprofen (ADVIL,MOTRIN) 800 MG tablet Take 1 tablet (800 mg total) by mouth 3 (three) times daily. Patient not taking: Reported on 06/27/2015 06/06/15   Vickki Hearing, MD  methocarbamol (ROBAXIN) 500 MG tablet Take 1 tablet (500 mg total) by mouth 3 (three) times daily. Patient not taking: Reported on 05/20/2015 05/01/15   Ivery Quale, PA-C  naproxen (NAPROSYN) 500 MG tablet Take 1 tablet (500 mg total) by mouth 2 (two) times daily. Patient not taking: Reported on 08/29/2015 05/20/15   Burgess Amor, PA-C  ondansetron (ZOFRAN) 4 MG tablet Take 1 tablet (4 mg total) by mouth every 6 (six) hours. Patient not taking: Reported on 08/29/2015 06/30/15   Catha Gosselin, PA-C  oxyCODONE-acetaminophen (PERCOCET/ROXICET) 5-325 MG per tablet Take 1 tablet by mouth every 4 (four) hours as needed. Patient not taking: Reported on 08/29/2015 05/20/15   Burgess Amor, PA-C   BP 126/68 mmHg  Pulse 64  Temp(Src) 97.5 F (36.4 C) (Oral)  Resp 18  Wt 212 lb (96.163 kg)  SpO2 99% Physical Exam  Constitutional: She appears well-developed and well-nourished.  HENT:  Head: Normocephalic and atraumatic.  Both TM's are dull, opaque. Effusion in the R ear, L TM has a small perforation  Eyes: Conjunctivae are normal. Right eye exhibits no discharge. Left eye exhibits no discharge.  Pulmonary/Chest: Effort normal. No respiratory distress.  Neurological: She is alert. Coordination normal.  Skin: Skin is warm and dry. No rash noted. She is not diaphoretic. No erythema.  Psychiatric: She  has a normal mood and affect.  Nursing note and vitals reviewed.   ED Course  Procedures  DIAGNOSTIC STUDIES: Oxygen Saturation is 100% on RA, normal by my interpretation.    COORDINATION OF CARE: 9:46 PM - Discussed bilateral ear infections and left TM perforation. Discussed plans to order diagnostic imaging, antibiotics, and cough medicine. Pt advised of plan for treatment and pt agrees.  Labs Review Labs Reviewed - No data to display  Imaging Review Dg Chest 2 View  08/29/2015  CLINICAL DATA:  Gradually worsening nonproductive cough over a week. Bilateral ear pain, back pain, sore throat, voice changes, chest pain. Smoker. EXAM: CHEST  2 VIEW COMPARISON:  None. FINDINGS: The heart size and mediastinal contours are within normal limits. Both lungs are clear. The visualized skeletal structures are unremarkable. IMPRESSION: No active cardiopulmonary disease. Electronically Signed   By: Burman Nieves M.D.   On: 08/29/2015 22:48   I have personally reviewed and evaluated these images and  lab results as part of my medical decision-making.   EKG Interpretation None      MDM   Final diagnoses:  Acute suppurative otitis media of both ears with spontaneous rupture of tympanic membranes, recurrence not specified  Bronchitis    34yF with OM. Abx. Return precautions discussed.   I personally preformed the services scribed in my presence. The recorded information has been reviewed is accurate. Raeford Razor, MD.   Raeford Razor, MD 09/11/15 [phone]

## 2015-08-29 NOTE — ED Notes (Addendum)
Pt c/o right earache, cough that is non productive that started a week ago, admits to chills, also admits to n/v,

## 2015-08-29 NOTE — ED Notes (Signed)
Monitor EKG was not working. Could not add patient on monitor and perform EKG in timely manner.

## 2015-09-14 ENCOUNTER — Emergency Department (HOSPITAL_COMMUNITY): Payer: Medicaid Other

## 2015-09-14 ENCOUNTER — Emergency Department (HOSPITAL_COMMUNITY)
Admission: EM | Admit: 2015-09-14 | Discharge: 2015-09-14 | Disposition: A | Discharge status: Home / Self Care | Payer: Medicaid Other | Attending: Emergency Medicine | Admitting: Emergency Medicine

## 2015-09-14 ENCOUNTER — Encounter (HOSPITAL_COMMUNITY): Payer: Self-pay | Admitting: *Deleted

## 2015-09-14 DIAGNOSIS — R51 Headache: Secondary | ICD-10-CM | POA: Diagnosis present

## 2015-09-14 DIAGNOSIS — M25511 Pain in right shoulder: Secondary | ICD-10-CM | POA: Diagnosis not present

## 2015-09-14 DIAGNOSIS — Z880 Allergy status to penicillin: Secondary | ICD-10-CM | POA: Diagnosis not present

## 2015-09-14 DIAGNOSIS — R200 Anesthesia of skin: Secondary | ICD-10-CM | POA: Insufficient documentation

## 2015-09-14 DIAGNOSIS — N390 Urinary tract infection, site not specified: Secondary | ICD-10-CM

## 2015-09-14 DIAGNOSIS — F17210 Nicotine dependence, cigarettes, uncomplicated: Secondary | ICD-10-CM | POA: Diagnosis not present

## 2015-09-14 DIAGNOSIS — H538 Other visual disturbances: Secondary | ICD-10-CM | POA: Diagnosis not present

## 2015-09-14 DIAGNOSIS — R202 Paresthesia of skin: Secondary | ICD-10-CM | POA: Insufficient documentation

## 2015-09-14 DIAGNOSIS — Z79899 Other long term (current) drug therapy: Secondary | ICD-10-CM | POA: Insufficient documentation

## 2015-09-14 DIAGNOSIS — R197 Diarrhea, unspecified: Secondary | ICD-10-CM | POA: Diagnosis not present

## 2015-09-14 DIAGNOSIS — G8929 Other chronic pain: Secondary | ICD-10-CM | POA: Insufficient documentation

## 2015-09-14 DIAGNOSIS — R112 Nausea with vomiting, unspecified: Secondary | ICD-10-CM

## 2015-09-14 DIAGNOSIS — R519 Headache, unspecified: Secondary | ICD-10-CM

## 2015-09-14 HISTORY — DX: Other chronic pain: G89.29

## 2015-09-14 HISTORY — DX: Pain, unspecified: R52

## 2015-09-14 HISTORY — DX: Dorsalgia, unspecified: M54.9

## 2015-09-14 LAB — URINALYSIS, ROUTINE W REFLEX MICROSCOPIC
Bilirubin Urine: NEGATIVE
GLUCOSE, UA: NEGATIVE mg/dL
Ketones, ur: NEGATIVE mg/dL
NITRITE: NEGATIVE
PROTEIN: NEGATIVE mg/dL
Specific Gravity, Urine: >1.03 — ABNORMAL HIGH (ref 1.005–1.030)
pH: 5.5 (ref 5.0–8.0)

## 2015-09-14 LAB — CBC WITH DIFFERENTIAL/PLATELET
BASOS PCT: 0 %
Basophils Absolute: 0 10*3/uL (ref 0.0–0.1)
EOS ABS: 0.2 10*3/uL (ref 0.0–0.7)
Eosinophils Relative: 2 %
HCT: 41.7 % (ref 36.0–46.0)
Hemoglobin: 14.1 g/dL (ref 12.0–15.0)
Lymphocytes Relative: 39 %
Lymphs Abs: 3 10*3/uL (ref 0.7–4.0)
MCH: 31.1 pg (ref 26.0–34.0)
MCHC: 33.8 g/dL (ref 30.0–36.0)
MCV: 92.1 fL (ref 78.0–100.0)
MONO ABS: 0.4 10*3/uL (ref 0.1–1.0)
Monocytes Relative: 6 %
Neutro Abs: 4 10*3/uL (ref 1.7–7.7)
Neutrophils Relative %: 53 %
Platelets: 208 10*3/uL (ref 150–400)
RBC: 4.53 MIL/uL (ref 3.87–5.11)
RDW: 12.4 % (ref 11.5–15.5)
WBC: 7.6 10*3/uL (ref 4.0–10.5)

## 2015-09-14 LAB — LIPASE, BLOOD: Lipase: 32 U/L (ref 11–51)

## 2015-09-14 LAB — COMPREHENSIVE METABOLIC PANEL
ALBUMIN: 4.2 g/dL (ref 3.5–5.0)
ALK PHOS: 44 U/L (ref 38–126)
ALT: 18 U/L (ref 14–54)
AST: 20 U/L (ref 15–41)
Anion gap: 5 (ref 5–15)
BILIRUBIN TOTAL: 0.2 mg/dL — AB (ref 0.3–1.2)
BUN: 14 mg/dL (ref 6–20)
CALCIUM: 9.1 mg/dL (ref 8.9–10.3)
CO2: 26 mmol/L (ref 22–32)
Chloride: 107 mmol/L (ref 101–111)
Creatinine, Ser: 0.78 mg/dL (ref 0.44–1.00)
GFR calc Af Amer: >60 mL/min (ref >60)
GFR calc non Af Amer: >60 mL/min (ref >60)
Glucose, Bld: 104 mg/dL — ABNORMAL HIGH (ref 65–99)
Potassium: 3.6 mmol/L (ref 3.5–5.1)
SODIUM: 138 mmol/L (ref 135–145)
TOTAL PROTEIN: 7.3 g/dL (ref 6.5–8.1)

## 2015-09-14 LAB — URINE MICROSCOPIC-ADD ON

## 2015-09-14 MED ORDER — NITROFURANTOIN MONOHYD MACRO 100 MG PO CAPS: 100.0000 mg | ORAL_CAPSULE | Freq: Two times a day (BID) | ORAL | Status: DC

## 2015-09-14 MED ORDER — METOCLOPRAMIDE HCL 10 MG PO TABS: 10.0000 mg | ORAL_TABLET | Freq: Four times a day (QID) | ORAL | Status: DC | PRN

## 2015-09-14 MED ORDER — METOCLOPRAMIDE HCL 5 MG/ML IJ SOLN
10.0000 mg | Freq: Once | INTRAMUSCULAR | Status: AC
  Administered 2015-09-14: 10 mg via INTRAVENOUS
  Filled 2015-09-14: qty 2

## 2015-09-14 MED ORDER — SODIUM CHLORIDE 0.9 % IV SOLN
INTRAVENOUS | Status: DC
  Administered 2015-09-14: 21:00:00 via INTRAVENOUS

## 2015-09-14 MED ORDER — SODIUM CHLORIDE 0.9 % IV BOLUS (SEPSIS)
1000.0000 mL | Freq: Once | INTRAVENOUS | Status: AC
  Administered 2015-09-14: 1000 mL via INTRAVENOUS

## 2015-09-14 MED ORDER — DIPHENHYDRAMINE HCL 50 MG/ML IJ SOLN
50.0000 mg | Freq: Once | INTRAMUSCULAR | Status: AC
  Administered 2015-09-14: 50 mg via INTRAVENOUS
  Filled 2015-09-14: qty 1

## 2015-09-14 NOTE — ED Notes (Signed)
Patients states that she has been having headaches for 2 weeks with n/v diarrhea x2 weeks. Blurred vision in both eyes. C/o right facial and right hand numbness.

## 2015-09-14 NOTE — ED Provider Notes (Signed)
CSN: [id number]     Arrival date & time 09/14/15  1330 History   First MD Initiated Contact with Patient 09/14/15 1749     Chief Complaint  Patient presents with  . Headache     HPI Pt was seen at 1755.  Per pt, c/o gradual onset and persistence of constant headache x2 weeks. Has been associated with "blurry vision," right facial and RUE "numbness."  Pt states her symptoms worsened this morning, states "now my whole face is numb." Pt states she has also had multiple intermittent episodes of N/V/D for the past 2 weeks. Denies abd pain, no new back pain, no CP/palpitations, no SOB/cough. Denies focal motor weakness, no ataxia, no slurred speech, no facial droop.  Denies headache was sudden or maximal in onset or at any time. Denies fevers, no rash.    Past Medical History  Diagnosis Date  . Pain management   . Chronic back pain    Past Surgical History  Procedure Laterality Date  . Abdominal hysterectomy    . Back surgery    . Cholecystectomy    . Tooth extraction      Social History  Substance Use Topics  . Smoking status: Current Every Day Smoker -- 1.00 packs/day    Types: Cigarettes  . Smokeless tobacco: Never Used  . Alcohol Use: No   OB History    Gravida Para Term Preterm AB TAB SAB Ectopic Multiple Living   Review of Systems ROS: Statement: All systems negative except as marked or noted in the HPI; Constitutional: Negative for fever and chills. ; ; Eyes: Negative for eye pain, redness and discharge. ; ; ENMT: Negative for ear pain, hoarseness, nasal congestion, sinus pressure and sore throat. ; ; Cardiovascular: Negative for chest pain, palpitations, diaphoresis, dyspnea and peripheral edema. ; ; Respiratory: Negative for cough, wheezing and stridor. ; ; Gastrointestinal: +N/V/D. Negative for abdominal pain, blood in stool, hematemesis, jaundice and rectal bleeding. . ; ; Genitourinary: Negative for dysuria, flank pain and hematuria. ; ; Musculoskeletal:  Negative for back pain and neck pain. Negative for swelling and trauma.; ; Skin: Negative for pruritus, rash, abrasions, blisters, bruising and skin lesion.; ; Neuro: +paresthesias, headache. Negative for lightheadedness and neck stiffness. Negative for weakness, altered level of consciousness , altered mental status, extremity weakness, involuntary movement, seizure and syncope.      Allergies  Penicillins; Codeine; and Toradol  Home Medications   Prior to Admission medications   Medication Sig Start Date End Date Taking? Authorizing Provider  acetaminophen (TYLENOL) 500 MG tablet Take 500-1,500 mg by mouth every 6 (six) hours as needed for mild pain or moderate pain.    Yes Historical Provider, MD  albuterol (PROVENTIL HFA;VENTOLIN HFA) 108 (90 BASE) MCG/ACT inhaler Inhale 2 puffs into the lungs every 6 (six) hours as needed for wheezing or shortness of breath.   Yes Historical Provider, MD  citalopram (CELEXA) 40 MG tablet Take 40 mg by mouth daily.   Yes Historical Provider, MD  estrogens, conjugated, (PREMARIN) 1.25 MG tablet Take 1.25 mg by mouth at bedtime.    Yes Historical Provider, MD  LORazepam (ATIVAN) 1 MG tablet Take 1 mg by mouth 3 (three) times daily. 08/17/15  Yes Historical Provider, MD  medroxyPROGESTERone (DEPO-PROVERA) 150 MG/ML injection Inject 150 mg into the muscle every 3 ([id number].   Yes Historical Provider, MD  amoxicillin (AMOXIL) 500 MG capsule Take 1  capsule (500 mg total) by mouth 3 (three) times daily. Patient not taking: Reported on 06/27/2015 06/19/15   Roxy Horseman, PA-C  amoxicillin (AMOXIL) 500 MG capsule Take 1 capsule (500 mg total) by mouth 3 (three) times daily. Patient not taking: Reported on 09/14/2015 08/29/15   Raeford Razor, MD  azithromycin (ZITHROMAX) 250 MG tablet 2 tabs day one, then 1 tab daily. Patient not taking: Reported on 08/29/2015 06/27/15   Ivery Quale, PA-C  clindamycin (CLEOCIN) 300 MG capsule Take 1 capsule (300 mg total) by  mouth 4 (four) times daily. For 7 days Patient not taking: Reported on 06/27/2015 06/05/15   Tammy Triplett, PA-C  dexamethasone (DECADRON) 4 MG tablet Take 1 tablet (4 mg total) by mouth 2 (two) times daily with a meal. Patient not taking: Reported on 08/29/2015 06/27/15   Ivery Quale, PA-C  HYDROcodone-acetaminophen (NORCO/VICODIN) 5-325 MG per tablet Take one tab po q 4-6 hrs prn pain Patient not taking: Reported on 06/27/2015 06/05/15   Tammy Triplett, PA-C  HYDROcodone-homatropine (HYCODAN) 5-1.5 MG/5ML syrup Take 5 mLs by mouth every 6 (six) hours as needed. Patient not taking: Reported on 08/29/2015 06/27/15   Ivery Quale, PA-C  ibuprofen (ADVIL,MOTRIN) 800 MG tablet Take 1 tablet (800 mg total) by mouth 3 (three) times daily. Patient not taking: Reported on 06/27/2015 06/06/15   Vickki Hearing, MD  methocarbamol (ROBAXIN) 500 MG tablet Take 1 tablet (500 mg total) by mouth 3 (three) times daily. Patient not taking: Reported on 05/20/2015 05/01/15   Ivery Quale, PA-C  naproxen (NAPROSYN) 500 MG tablet Take 1 tablet (500 mg total) by mouth 2 (two) times daily. Patient not taking: Reported on 08/29/2015 05/20/15   Burgess Amor, PA-C  ondansetron (ZOFRAN) 4 MG tablet Take 1 tablet (4 mg total) by mouth every 6 (six) hours. Patient not taking: Reported on 08/29/2015 06/30/15   Catha Gosselin, PA-C  oxyCODONE-acetaminophen (PERCOCET/ROXICET) 5-325 MG per tablet Take 1 tablet by mouth every 4 (four) hours as needed. Patient not taking: Reported on 08/29/2015 05/20/15   Burgess Amor, PA-C  traMADol (ULTRAM) 50 MG tablet Take 1 tablet (50 mg total) by mouth every 6 (six) hours as needed. 08/29/15   Raeford Razor, MD   BP 110/70 mmHg  Pulse 65  Temp(Src) 98.3 F (36.8 C) (Oral)  Resp 14  Ht  (1.626 m)  Wt 200 lb (90.719 kg)  BMI 34.31 kg/m2  SpO2 98%  LMP  Physical Exam  1800: Physical examination:  Nursing notes reviewed; Vital signs and O2 SAT reviewed;  Constitutional: Well  developed, Well nourished, Well hydrated, In no acute distress; Head:  Normocephalic, atraumatic; Eyes: EOMI, PERRL, No scleral icterus; ENMT: Mouth and pharynx normal, Mucous membranes moist; Neck: Supple, Full range of motion, No lymphadenopathy; Cardiovascular: Regular rate and rhythm, No murmur, rub, or gallop; Respiratory: Breath sounds clear & equal bilaterally, No rales, rhonchi, wheezes.  Speaking full sentences with ease, Normal respiratory effort/excursion; Chest: Nontender, Movement normal; Abdomen: Soft, Nontender, Nondistended, Normal bowel sounds; Genitourinary: No CVA tenderness; Spine:  No midline CS, TS, LS tenderness. +TTP right hypertonic trapezius muscle. No rash.;; Extremities: Pulses normal, No tenderness, No edema, No calf edema or asymmetry.; Neuro: AA&Ox3, Major CN grossly intact. No facial droop. Speech clear. Strength 5/5 equal bilat UE's and LE's. DTR 2/4 equal bilat UE's and LE's. +subjective decreased sensation "entire face and right arm.:; Skin: Color normal, Warm, Dry.   ED Course  Procedures (including critical care time) Labs Review   Imaging Review  I have personally reviewed and evaluated these images and lab results as part of my medical decision-making.   EKG Interpretation None      MDM  MDM Reviewed: previous chart, nursing note and vitals Reviewed previous: labs Interpretation: labs, MRI, CT scan and x-ray      Results for orders placed or performed during the hospital encounter of 09/14/15  Comprehensive metabolic panel  Result Value Ref Range   Sodium 138 135 - 145 mmol/L   Potassium 3.6 3.5 - 5.1 mmol/L   Chloride 107 101 - 111 mmol/L   CO2 26 22 - 32 mmol/L   Glucose, Bld 104 (H) 65 - 99 mg/dL   BUN 14 6 - 20 mg/dL   Creatinine, Ser 0.10 0.44 - 1.00 mg/dL   Calcium 9.1 8.9 - 27.2 mg/dL   Total Protein 7.3 6.5 - 8.1 g/dL   Albumin 4.2 3.5 - 5.0 g/dL   AST 20 15 - 41 U/L   ALT 18 14 - 54 U/L   Alkaline Phosphatase 44 38 - 126 U/L    Total Bilirubin 0.2 (L) 0.3 - 1.2 mg/dL   GFR calc non Af Amer >60 >60 mL/min   GFR calc Af Amer >60 >60 mL/min   Anion gap 5 5 - 15  Lipase, blood  Result Value Ref Range   Lipase 32 11 - 51 U/L  CBC with Differential  Result Value Ref Range   WBC 7.6 4.0 - 10.5 K/uL   RBC 4.53 3.87 - 5.11 MIL/uL   Hemoglobin 14.1 12.0 - 15.0 g/dL   HCT 53.6 64.4 - 03.4 %   MCV 92.1 78.0 - 100.0 fL   MCH 31.1 26.0 - 34.0 pg   MCHC 33.8 30.0 - 36.0 g/dL   RDW 74.2 59.5 - 63.8 %   Platelets 208 150 - 400 K/uL   Neutrophils Relative % 53 %   Neutro Abs 4.0 1.7 - 7.7 K/uL   Lymphocytes Relative 39 %   Lymphs Abs 3.0 0.7 - 4.0 K/uL   Monocytes Relative 6 %   Monocytes Absolute 0.4 0.1 - 1.0 K/uL   Eosinophils Relative 2 %   Eosinophils Absolute 0.2 0.0 - 0.7 K/uL   Basophils Relative 0 %   Basophils Absolute 0.0 0.0 - 0.1 K/uL  Urinalysis, Routine w reflex microscopic  Result Value Ref Range   Color, Urine YELLOW YELLOW   APPearance HAZY (A) CLEAR   Specific Gravity, Urine >1.030 (H) 1.005 - 1.030   pH 5.5 5.0 - 8.0   Glucose, UA NEGATIVE NEGATIVE mg/dL   Hgb urine dipstick TRACE (A) NEGATIVE   Bilirubin Urine NEGATIVE NEGATIVE   Ketones, ur NEGATIVE NEGATIVE mg/dL   Protein, ur NEGATIVE NEGATIVE mg/dL   Nitrite NEGATIVE NEGATIVE   Leukocytes, UA TRACE (A) NEGATIVE  Urine microscopic-add on  Result Value Ref Range   Squamous Epithelial / LPF 6-30 (A) NONE SEEN   WBC, UA 6-30 0 - 5 WBC/hpf   RBC / HPF 0-5 0 - 5 RBC/hpf   Bacteria, UA MANY (A) NONE SEEN   Ct Cervical Spine Wo Contrast 09/14/2015  CLINICAL DATA:  Headaches, blurred vision, right facial and right hand numbness since 10 a.m. this morning. EXAM: CT CERVICAL SPINE WITHOUT CONTRAST TECHNIQUE: Multidetector CT imaging of the cervical spine was performed without intravenous contrast. Multiplanar CT image reconstructions were also generated. COMPARISON:  None. FINDINGS: Normal alignment of the cervical vertebral bodies. Disc spaces  and vertebral bodies are maintained. No acute fracture  or abnormal prevertebral soft tissue swelling. The facets are normally aligned. The skullbase C1 and C1-2 articulations are maintained. The dens is normal. No large disc protrusions, spinal or foraminal stenosis. The lung apices are clear. IMPRESSION: Normal CT examination of the cervical spine. Electronically Signed   By: Rudie MeyerP.  Gallerani M.D.   On: 09/14/2015 19:16   Mr Brain Wo Contrast (neuro Protocol) 09/14/2015  CLINICAL DATA:  Headache and numbness. Blurred vision and right-sided weakness beginning at 10 o'clock today. EXAM: MRI HEAD WITHOUT CONTRAST TECHNIQUE: Multiplanar, multiecho pulse sequences of the brain and surrounding structures were obtained without intravenous contrast. COMPARISON:  None. FINDINGS: The study is mildly degraded by patient motion. No acute infarct, hemorrhage, or mass lesion is present. The ventricles are of normal size. No significant extraaxial fluid collection is present. No significant white matter disease is present. The internal auditory canals are within normal limits. The brainstem and cerebellum are normal. Flow is present in the major intracranial arteries. The globes and orbits are intact. Paranasal sinuses are clear. There is fluid in the mastoid air cells bilaterally. No obstructing nasopharyngeal lesion is present. The skullbase is otherwise within normal limits. Midline structures are unremarkable. IMPRESSION: 1. Normal MRI of the brain. 2. Bilateral mastoid effusions no obstructing nasopharyngeal lesion is evident. Electronically Signed   By: Marin Robertshristopher  Mattern M.D.   On: 09/14/2015 19:05   Dg Abd Acute W/chest 09/14/2015  CLINICAL DATA:  Headache, nausea, vomiting and diarrhea for 2 weeks. EXAM: DG ABDOMEN ACUTE W/ 1V CHEST COMPARISON:  08/29/2015 FINDINGS: The upright chest x-ray is normal. The bowel gas pattern is unremarkable. No findings for obstruction or perforation. The soft tissue shadows are  maintained no worrisome calcifications. The visualized bony structures are unremarkable. Lumbar fusion hardware is noted. IMPRESSION: No acute cardiopulmonary findings. No plain film findings for an acute abdominal process. Electronically Signed   By: Rudie MeyerP.  Gallerani M.D.   On: 09/14/2015 19:17    2125:  +UTI, UC pending; rx macrobid. Workup reassuring. Symptoms improved after IVF and headache cocktail. Pt has tol PO well while in the ED without N/V.  No stooling while in the ED.  Abd remains benign, VSS. Pt wants to go home now. Tx symptomatically at this time. Pt is in Pain Management, will not rx narcotics. Dx and testing d/w pt.  Questions answered.  Verb understanding, agreeable to d/c home with outpt f/u.      Samuel JesterKathleen Morocco Gipe, DO 09/20/15 [phone]

## 2015-09-14 NOTE — ED Notes (Signed)
Pt told registration that she thought she was having a stroke because her numbness was more intense.  Pt says the symptoms started this morning around 10am.  VSS.  Will notify charge RN

## 2015-09-14 NOTE — Discharge Instructions (Signed)
°Emergency Department Resource Guide °1) Find a Doctor and Pay Out of Pocket °Although you won't have to find out who is covered by your insurance plan, it is a good idea to ask around and get recommendations. You will then need to call the office and see if the doctor you have chosen will accept you as a new patient and what types of options they offer for patients who are self-pay. Some doctors offer discounts or will set up payment plans for their patients who do not have insurance, but you will need to ask so you aren't surprised when you get to your appointment. ° °2) Contact Your Local Health Department °Not all health departments have doctors that can see patients for sick visits, but many do, so it is worth a call to see if yours does. If you don't know where your local health department is, you can check in your phone book. The CDC also has a tool to help you locate your state's health department, and many state websites also have listings of all of their local health departments. ° °3) Find a Walk-in Clinic °If your illness is not likely to be very severe or complicated, you may want to try a walk in clinic. These are popping up all over the country in pharmacies, drugstores, and shopping centers. They're usually staffed by nurse practitioners or physician assistants that have been trained to treat common illnesses and complaints. They're usually fairly quick and inexpensive. However, if you have serious medical issues or chronic medical problems, these are probably not your best option. ° °No Primary Care Doctor: °- Call [facility] at  832-8000 - they can help you locate a primary care doctor that  accepts your insurance, provides certain services, etc. °- Physician Referral Service- 1-800-533-3463 ° °Chronic Pain Problems: °Organization         Address  Phone   Notes  °[facility]  (336) 297-2271 Patients need to be referred by their primary care doctor.  ° °Medication  Assistance: °Organization         Address  Phone   Notes  °[place] [facility] [address] °[place], [place] [address] (336) 641-8030 --Must be a resident of [place] °-- Must have NO insurance coverage whatsoever (no Medicaid/ Medicare, etc.) °-- The pt. MUST have a primary care doctor that directs their care regularly and follows them in the community °  °[facility]  (866) 331-1348   °[employer]  (888) 892-1162   ° °Agencies that provide inexpensive medical care: °Organization         Address  Phone   Notes  °[facility]  (336) 832-8035   °[facility]    (336) 832-7272   °[facility] [address] °[place], [place] [address] (336) 832-4777   °[facility] [place] [address], °[place] (336) 271-4999   °[facility]    (336) 373-0678   °[facility]    (336) 272-1050   °[facility] ° [address], [place] Phone:  (336) 832-4444, Fax:  (336) 832-4440 Hours of Operation:  9 am - 6 pm, M-F.  Also accepts Medicaid/Medicare and self-pay.  °[facility] ° [address], [place] Phone: (336) 832-3150, Fax: (336) [phone]. Hours of Operation:  8:30 am - 5:30 pm, M-F.  Also accepts Medicaid and self-pay.  °[facility] [place] [address], [place] Phone: (336) 878-6027   °[facility] [address], [place], [place] (336)723-1848, Ext. 123 Mondays & Thursdays: 7-9 AM.  First 15 patients are seen on a first come, first serve basis. °  ° °Medicaid-accepting [place] Providers: ° °Organization         Address  Phone   Notes  °[facility] [address], [place] (336) 641-2100 Also accepts self-pay patients.  °[facility] [address], [place] ° (336) 856-9996   °[facility] [address], [place]  (336) 288-8857   °[facility] [address] [place] [address], [place] (336) 299-7000   °Veita Bland [address], [place]  ° (336) 373-1557 Only accepts [place] Access Medicaid patients after they have their name applied to their card.  ° °Self-Pay (no insurance) in [place]: ° °Organization         Address  Phone   Notes  °Sickle Cell Patients, [facility] [address], [place] (336) 832-1970   °[facility] [address], [place] (336) 832-4400   °[facility] ° [address], [place] (336) 992-4800   °[facility]/Dr. Osei-Bonsu ° [address] [place] [address], [place] or [address], [place] (336) 841-8500 Phone number for both [place] and [place] locations is the same.  °[facility] [address], [place] (336) 299-0000   °[facility] [address] [place] [address], [place] or [address] (336) 852-7530 °(336) 878-2260   °[facility] [address], [place] (336) 350-1642, phone; (336) 294-5005, fax Sees patients 1st and 3rd Saturday of every month.  Must not qualify for public or private insurance (i.e. Medicaid, Medicare, [place] Health Choice, Veterans' Benefits) • Household income should be no more than 200% of the poverty level •The clinic cannot treat you if you are pregnant or think you are pregnant • Sexually transmitted diseases are not treated at the clinic.  ° ° °Dental Care: °Organization         Address  Phone  Notes  °[place] [facility] [address], [place] (336) 641-6152 Accepts children up to age 21 who are enrolled in Medicaid or [place] Health Choice; pregnant women with a Medicaid card; and children who have applied for Medicaid or [place] Health Choice, but were declined, whose parents can pay a reduced fee at time of service.  °[place]  [facility] [place]  [address], [place] (336) 641-7733 Accepts children up to age 21 who are enrolled in Medicaid or [place] Health Choice; pregnant women with a Medicaid card; and children who have applied for Medicaid or [place] Health Choice, but were declined, whose parents can pay a reduced fee at time of service.  °[facility] ° [address], [place] (336) 641-4533 Patients are seen by appointment only. Walk-ins are not accepted. [facility] will see patients 18 years of age and older. °Monday - Tuesday (8am-5pm) °Most Wednesdays (8:30-5pm) °$30 per visit, cash only  °[facility] ° [address], [place] (336) 641-4533 Patients are seen by appointment only. Walk-ins are not accepted. [facility] will see patients 18 years of age and older. °One   Wednesday Evening (Monthly: Volunteer Based).  $30 per visit, cash only  °[facility]  (919) 537-3737 for adults; Children under age 4, call Graduate Pediatric Dentistry at ([phone]) [phone]. Children aged 4-14, please call ([phone]) [phone] to request a pediatric application. ° Dental services are provided in all areas of dental care including fillings, crowns and bridges, complete and partial dentures, implants, gum treatment, root canals, and extractions. Preventive care is also provided. Treatment is provided to both adults and children. °Patients are selected via a lottery and there is often a waiting list. °  °[facility] [address], °[place] ° (336) 763-8833 [email or web address] °  °[facility] [address], [place], [place] (336)723-1848, Ext. 123 Second and Fourth Thursday of each month, opens at 6:30 AM; Clinic ends at 9 AM.  Patients are seen on a first-come first-served basis, and a limited number are seen during each clinic.  ° °[facility] ° [address], [place], [place] (336) 723-7904    Eligibility Requirements °You must have lived in [place], [place], or [place] counties for at least the last three months. °  You cannot be eligible for state or federal sponsored healthcare insurance, including Veterans Administration, Medicaid, or Medicare. °  You generally cannot be eligible for healthcare insurance through your employer.  °  How to apply: °Eligibility screenings are held every Tuesday and Wednesday afternoon from 1:00 pm until 4:00 pm. You do not need an appointment for the interview!  °[facility] [address], [place], [place] 336-631-2330   °[facility]  336-342-8273   °[facility]  336-703-3100   °[facility]  336-570-6415   ° °Behavioral Health Resources in the Community: °Intensive Outpatient Programs °Organization         Address  Phone  Notes  °[facility] [address], [place], [place] 336-878-6098   °[facility] [address], [place], [place] 336-832-9800   °[facility] [address], [place], [place] ° 336-882-2125   °[facility] [address],  °[place], [place] 1-800-853-5163 or 336-641-4981   °Substance Abuse Resources °Organization         Address  Phone  Notes  °[facility]  336-882-2125   °[facility]  336-784-9470   °[facility]  336-285-9073   °[facility]  336-845-3988   °Residential & Outpatient Substance Abuse Program  1-800-659-3381   °Psychological Services °Organization         Address  Phone  Notes  °[facility]  336- 832-9600   °[facility]  336- 378-7881   °[facility] [address], [place] 1-800-853-5163 or 336-641-4981   ° °Mobile Crisis Teams °Organization         Address  Phone  Notes  °[facility], Mobile Crisis Care Unit  1-877-626-1772   °Assertive °[facility] ° [address]  [place], [place] 336-834-9664   °Sharon DeEsch [address] °[place] 336-554-5454   ° °Self-Help/Support Groups °Organization         Address  Phone             Notes  °[facility]  - variety of support groups  336- 373-1402 Call for more information  °Narcotics Anonymous (NA), [facility] [address], °[place]  2 meetings at this location  ° °  Residential Treatment Programs Organization         Address  Phone  Notes  [facility] [address],    [place]  [phone]   [facility]  [address], [place] [phone], [place], [place] [phone]   [facility] [address], [place] [phone] Admissions: 8am-3pm M-F  [facility] [address]    [place], [place] [phone](934) [phone]   [facility] [address], [place], [place] [phone]   [facility] [address]  [place], [place] [phone]   [facility] Intensive Outpatient [address], [place], [place] [phone]   [facility] [address]  [place], [place] [phone]) [phone] or ([phone]   [facility] ([facility] [address] [place], [place] [phone] Accepts Medicaid  [facility] [address]  [place] [phone] Substance Abuse/Addiction Treatment   [facility]         Address  Phone  Notes  [facility]  [phone](888) [phone]   Angie FavaJulie Brannon, PhD [address], [address] [place], [place]   (380) [phone](336) [phone] or ([phone](336) [phone]   [facility]   [address] [place], [place] [phone](336) [phone]   [facility] [address], [place], [place] ([phone](336) [phone] Insurance/Medicaid/sponsorship through [facility] [address]                                    [place], [place] [phone]) [phone] Therapy/tele-psych/case    [facility] [address].   [place], [place] [phone](336) [phone]    Dr. Lolly MustacheArfeen  ([phone](336) [phone]   [facility]  [employer] [facility] 1) [address], [place] 2) [address], [place] 3)  [address], [place] [phone](336) [phone](336) [phone]  [phone]) [phone]   [facility] Child Abuse Hotline [phone](336) 506-086-0026 or [phone](336) ([phone])[phone] (After Hours)      Take over the counter tylenol and ibuprofen (OR excedrin) and benadryl, as directed on packaging, with the prescription given to you today, as needed for headache. Take the prescriptions as directed. Call your regular medical doctor tomorrow to schedule a follow up appointment within the next 3  days.  Return to the Emergency Department immediately sooner if worsening.

## 2015-09-16 LAB — URINE CULTURE

## 2015-10-30 ENCOUNTER — Encounter (HOSPITAL_COMMUNITY): Payer: Self-pay | Admitting: *Deleted

## 2015-10-30 ENCOUNTER — Emergency Department (HOSPITAL_COMMUNITY): Payer: Medicaid Other

## 2015-10-30 ENCOUNTER — Emergency Department (HOSPITAL_COMMUNITY)
Admission: EM | Admit: 2015-10-30 | Discharge: 2015-10-31 | Discharge status: Against Medical Advice | Payer: Medicaid Other | Attending: Emergency Medicine | Admitting: Emergency Medicine

## 2015-10-30 DIAGNOSIS — G8929 Other chronic pain: Secondary | ICD-10-CM | POA: Insufficient documentation

## 2015-10-30 DIAGNOSIS — R630 Anorexia: Secondary | ICD-10-CM | POA: Insufficient documentation

## 2015-10-30 DIAGNOSIS — R1031 Right lower quadrant pain: Secondary | ICD-10-CM | POA: Diagnosis present

## 2015-10-30 DIAGNOSIS — R509 Fever, unspecified: Secondary | ICD-10-CM | POA: Diagnosis not present

## 2015-10-30 DIAGNOSIS — R062 Wheezing: Secondary | ICD-10-CM | POA: Diagnosis not present

## 2015-10-30 DIAGNOSIS — R112 Nausea with vomiting, unspecified: Secondary | ICD-10-CM | POA: Insufficient documentation

## 2015-10-30 DIAGNOSIS — R1033 Periumbilical pain: Secondary | ICD-10-CM | POA: Insufficient documentation

## 2015-10-30 DIAGNOSIS — Z880 Allergy status to penicillin: Secondary | ICD-10-CM | POA: Diagnosis not present

## 2015-10-30 DIAGNOSIS — F17210 Nicotine dependence, cigarettes, uncomplicated: Secondary | ICD-10-CM | POA: Insufficient documentation

## 2015-10-30 DIAGNOSIS — Z79899 Other long term (current) drug therapy: Secondary | ICD-10-CM | POA: Insufficient documentation

## 2015-10-30 LAB — CBC WITH DIFFERENTIAL/PLATELET
BASOS ABS: 0 10*3/uL (ref 0.0–0.1)
BASOS PCT: 0 %
EOS ABS: 0.2 10*3/uL (ref 0.0–0.7)
Eosinophils Relative: 2 %
HEMATOCRIT: 40.3 % (ref 36.0–46.0)
HEMOGLOBIN: 13.4 g/dL (ref 12.0–15.0)
Lymphocytes Relative: 41 %
Lymphs Abs: 2.7 10*3/uL (ref 0.7–4.0)
MCH: 31.2 pg (ref 26.0–34.0)
MCHC: 33.3 g/dL (ref 30.0–36.0)
MCV: 93.7 fL (ref 78.0–100.0)
MONO ABS: 0.5 10*3/uL (ref 0.1–1.0)
Monocytes Relative: 7 %
NEUTROS ABS: 3.4 10*3/uL (ref 1.7–7.7)
NEUTROS PCT: 50 %
Platelets: 188 10*3/uL (ref 150–400)
RBC: 4.3 MIL/uL (ref 3.87–5.11)
RDW: 13 % (ref 11.5–15.5)
WBC: 6.7 10*3/uL (ref 4.0–10.5)

## 2015-10-30 LAB — COMPREHENSIVE METABOLIC PANEL
ALBUMIN: 4 g/dL (ref 3.5–5.0)
ALT: 19 U/L (ref 14–54)
ANION GAP: 9 (ref 5–15)
AST: 31 U/L (ref 15–41)
Alkaline Phosphatase: 39 U/L (ref 38–126)
BILIRUBIN TOTAL: 1.1 mg/dL (ref 0.3–1.2)
BUN: 14 mg/dL (ref 6–20)
CO2: 23 mmol/L (ref 22–32)
Calcium: 8.8 mg/dL — ABNORMAL LOW (ref 8.9–10.3)
Chloride: 109 mmol/L (ref 101–111)
Creatinine, Ser: 0.86 mg/dL (ref 0.44–1.00)
GFR calc Af Amer: >60 mL/min (ref >60)
GFR calc non Af Amer: >60 mL/min (ref >60)
GLUCOSE: 94 mg/dL (ref 65–99)
POTASSIUM: 5 mmol/L (ref 3.5–5.1)
SODIUM: 141 mmol/L (ref 135–145)
TOTAL PROTEIN: 6.6 g/dL (ref 6.5–8.1)

## 2015-10-30 LAB — URINE MICROSCOPIC-ADD ON
Bacteria, UA: NONE SEEN
WBC, UA: NONE SEEN WBC/hpf (ref 0–5)

## 2015-10-30 LAB — URINALYSIS, ROUTINE W REFLEX MICROSCOPIC
BILIRUBIN URINE: NEGATIVE
Glucose, UA: NEGATIVE mg/dL
KETONES UR: NEGATIVE mg/dL
LEUKOCYTES UA: NEGATIVE
NITRITE: NEGATIVE
PROTEIN: NEGATIVE mg/dL
Specific Gravity, Urine: 1.025 (ref 1.005–1.030)
pH: 6 (ref 5.0–8.0)

## 2015-10-30 LAB — LIPASE, BLOOD: Lipase: 18 U/L (ref 11–51)

## 2015-10-30 MED ORDER — DIATRIZOATE MEGLUMINE & SODIUM 66-10 % PO SOLN
ORAL | Status: DC
Start: 2015-10-30 — End: 2015-10-31
  Filled 2015-10-30: qty 30

## 2015-10-30 MED ORDER — ONDANSETRON HCL 4 MG/2ML IJ SOLN
4.0000 mg | Freq: Once | INTRAMUSCULAR | Status: AC
  Administered 2015-10-30: 4 mg via INTRAVENOUS
  Filled 2015-10-30: qty 2

## 2015-10-30 MED ORDER — IOHEXOL 300 MG/ML  SOLN
100.0000 mL | Freq: Once | INTRAMUSCULAR | Status: AC | PRN
  Administered 2015-10-30: 100 mL via INTRAVENOUS

## 2015-10-30 MED ORDER — IOHEXOL 300 MG/ML  SOLN
25.0000 mL | Freq: Once | INTRAMUSCULAR | Status: AC | PRN
  Administered 2015-10-30: 25 mL via ORAL

## 2015-10-30 MED ORDER — PROMETHAZINE HCL 25 MG/ML IJ SOLN
12.5000 mg | Freq: Once | INTRAMUSCULAR | Status: AC
  Administered 2015-10-30: 12.5 mg via INTRAVENOUS
  Filled 2015-10-30: qty 1

## 2015-10-30 MED ORDER — SODIUM CHLORIDE 0.9 % IV SOLN
INTRAVENOUS | Status: DC
  Administered 2015-10-30: 22:00:00 via INTRAVENOUS

## 2015-10-30 MED ORDER — HYDROMORPHONE HCL 1 MG/ML IJ SOLN
1.0000 mg | Freq: Once | INTRAMUSCULAR | Status: AC
  Administered 2015-10-30: 1 mg via INTRAVENOUS
  Filled 2015-10-30: qty 1

## 2015-10-30 MED ORDER — HYDROMORPHONE HCL 1 MG/ML IJ SOLN
1.0000 mg | Freq: Once | INTRAMUSCULAR | Status: AC
  Administered 2015-10-31: 1 mg via INTRAVENOUS
  Filled 2015-10-30: qty 1

## 2015-10-30 NOTE — ED Notes (Signed)
Pt c/o central abd pain that started a few hours ago with vomiting, unsure of fever, denies any diarrhea,

## 2015-10-30 NOTE — ED Provider Notes (Signed)
CSN: [id number]     Arrival date & time 10/30/15  2044 History   First MD Initiated Contact with Patient 10/30/15 2132     Chief Complaint  Patient presents with  . Abdominal Pain     (Consider location/radiation/quality/duration/timing/severity/associated sxs/prior Treatment) Patient is a 35 y.o. female presenting with abdominal pain. The history is provided by the patient.  Abdominal Pain Pain location:  Periumbilical Pain quality: sharp   Pain radiates to:  RLQ Pain severity:  Severe Onset quality:  Sudden Duration:  5 hours Timing:  Constant Progression:  Worsening Chronicity:  New Relieved by:  Nothing Worsened by:  Nothing tried Ineffective treatments:  None tried Associated symptoms: anorexia, chills, fever, nausea and vomiting   Associated symptoms: no dysuria    Alyssa Graham is a 35 y.o. female currently in a pain management clinic presents to the ED with abdominal pain. The pain started around the umbilicus and now radiates to the RLQ. Associated symptoms include nausea and vomiting. She is unsure of fever. She denies diarrhea. She rates the pain as 10/10.   Past Medical History  Diagnosis Date  . Pain management   . Chronic back pain    Past Surgical History  Procedure Laterality Date  . Abdominal hysterectomy    . Back surgery    . Cholecystectomy    . Tooth extraction     No family history on file. Social History  Substance Use Topics  . Smoking status: Current Every Day Smoker -- 1.00 packs/day    Types: Cigarettes  . Smokeless tobacco: Never Used  . Alcohol Use: No   OB History    Gravida Para Term Preterm AB TAB SAB Ectopic Multiple Living   Review of Systems  Constitutional: Positive for fever and chills.  Gastrointestinal: Positive for nausea, vomiting, abdominal pain and anorexia.  Genitourinary: Negative for dysuria.  all other systems negative    Allergies  Penicillins; Codeine; and Toradol  Home Medications    Prior to Admission medications   Medication Sig Start Date End Date Taking? Authorizing Provider  acetaminophen (TYLENOL) 500 MG tablet Take 500-1,500 mg by mouth every 6 (six) hours as needed for mild pain or moderate pain.    Yes Historical Provider, MD  albuterol (PROVENTIL HFA;VENTOLIN HFA) 108 (90 BASE) MCG/ACT inhaler Inhale 2 puffs into the lungs every 6 (six) hours as needed for wheezing or shortness of breath.   Yes Historical Provider, MD  citalopram (CELEXA) 40 MG tablet Take 40 mg by mouth daily.   Yes Historical Provider, MD  estrogens, conjugated, (PREMARIN) 1.25 MG tablet Take 1.25 mg by mouth at bedtime.    Yes Historical Provider, MD  medroxyPROGESTERone (DEPO-PROVERA) 150 MG/ML injection Inject 150 mg into the muscle every 3 (three) months.   Yes Historical Provider, MD  amoxicillin (AMOXIL) 500 MG capsule Take 1 capsule (500 mg total) by mouth 3 (three) times daily. Patient not taking: Reported on 06/27/2015 06/19/15   Roxy Horseman, PA-C  amoxicillin (AMOXIL) 500 MG capsule Take 1 capsule (500 mg total) by mouth 3 (three) times daily. Patient not taking: Reported on 09/14/2015 08/29/15   Raeford Razor, MD  azithromycin (ZITHROMAX) 250 MG tablet 2 tabs day one, then 1 tab daily. Patient not taking: Reported on 08/29/2015 06/27/15   Ivery Quale, PA-C  dexamethasone (DECADRON) 4 MG tablet Take 1 tablet (4 mg total) by mouth 2 (two) times daily with a  meal. Patient not taking: Reported on 08/29/2015 06/27/15   Ivery Quale, PA-C  HYDROcodone-homatropine [facility]) 5-1.5 MG/5ML syrup Take 5 mLs by mouth every 6 (six) hours as needed. Patient not taking: Reported on 08/29/2015 06/27/15   Ivery Quale, PA-C  metoCLOPramide (REGLAN) 10 MG tablet Take 1 tablet (10 mg total) by mouth every 6 (six) hours as needed for nausea (or headache). Patient not taking: Reported on 10/30/2015 09/14/15   Samuel Jester, DO  nitrofurantoin, macrocrystal-monohydrate, (MACROBID) 100 MG capsule Take 1  capsule (100 mg total) by mouth 2 (two) times daily. Patient not taking: Reported on 10/30/2015 09/14/15   Samuel Jester, DO  traMADol (ULTRAM) 50 MG tablet Take 1 tablet (50 mg total) by mouth every 6 (six) hours as needed. Patient not taking: Reported on 10/30/2015 08/29/15   Raeford Razor, MD   BP 109/56 mmHg  Pulse 63  Temp(Src) 98.2 F (36.8 C) (Oral)  Resp 22  Wt 82.101 kg  SpO2 100% Physical Exam  Constitutional: She is oriented to person, place, and time. She appears well-developed and well-nourished. No distress.  HENT:  Head: Normocephalic and atraumatic.  Eyes: Conjunctivae and EOM are normal.  Neck: Normal range of motion. Neck supple.  Cardiovascular: Normal rate and regular rhythm.   Pulmonary/Chest: Effort normal. No respiratory distress. She has wheezes. She has no rales. She exhibits no tenderness.  Abdominal: Soft. Bowel sounds are normal. There is tenderness in the right lower quadrant. There is guarding. There is no rigidity, no rebound and no CVA tenderness.  Genitourinary:  External genitalia without lesions, yellow d/c vaginal vault, cervix absent, right adnexal tenderness, uterus absent.   Musculoskeletal: Normal range of motion.  Neurological: She is alert and oriented to person, place, and time. No cranial nerve deficit.  Skin: Skin is warm and dry.  Psychiatric: She has a normal mood and affect. Her behavior is normal.  Nursing note and vitals reviewed.   ED Course: labs, CT of abdomen/pelvis, ultrasound Dilaudid 1 mg IV, Zofran 4 mg IV,   Patient continued to complains of severe pain and nausea, Dilaudid repeated and Phenergan 12.5 mg IV given.   After medications she still rates her pain as 6/10.    Procedures (including critical care time) Labs Review Results for orders placed or performed during the hospital encounter of 10/30/15 (from the past 24 hour(s))  CBC with Differential     Status: None   Collection Time: 10/30/15 10:04 PM  Result  Value Ref Range   WBC 6.7 4.0 - 10.5 K/uL   RBC 4.30 3.87 - 5.11 MIL/uL   Hemoglobin 13.4 12.0 - 15.0 g/dL   HCT 16.1 09.6 - 04.5 %   MCV 93.7 78.0 - 100.0 fL   MCH 31.2 26.0 - 34.0 pg   MCHC 33.3 30.0 - 36.0 g/dL   RDW 40.9 81.1 - 91.4 %   Platelets 188 150 - 400 K/uL   Neutrophils Relative % 50 %   Neutro Abs 3.4 1.7 - 7.7 K/uL   Lymphocytes Relative 41 %   Lymphs Abs 2.7 0.7 - 4.0 K/uL   Monocytes Relative 7 %   Monocytes Absolute 0.5 0.1 - 1.0 K/uL   Eosinophils Relative 2 %   Eosinophils Absolute 0.2 0.0 - 0.7 K/uL   Basophils Relative 0 %   Basophils Absolute 0.0 0.0 - 0.1 K/uL  Comprehensive metabolic panel     Status: Abnormal   Collection Time: 10/30/15 10:04 PM  Result Value Ref Range   Sodium 141  135 - 145 mmol/L   Potassium 5.0 3.5 - 5.1 mmol/L   Chloride 109 101 - 111 mmol/L   CO2 23 22 - 32 mmol/L   Glucose, Bld 94 65 - 99 mg/dL   BUN 14 6 - 20 mg/dL   Creatinine, Ser 6.57 0.44 - 1.00 mg/dL   Calcium 8.8 (L) 8.9 - 10.3 mg/dL   Total Protein 6.6 6.5 - 8.1 g/dL   Albumin 4.0 3.5 - 5.0 g/dL   AST 31 15 - 41 U/L   ALT 19 14 - 54 U/L   Alkaline Phosphatase 39 38 - 126 U/L   Total Bilirubin 1.1 0.3 - 1.2 mg/dL   GFR calc non Af Amer >60 >60 mL/min   GFR calc Af Amer >60 >60 mL/min   Anion gap 9 5 - 15  Lipase, blood     Status: None   Collection Time: 10/30/15 10:04 PM  Result Value Ref Range   Lipase 18 11 - 51 U/L  Urinalysis, Routine w reflex microscopic     Status: Abnormal   Collection Time: 10/30/15 10:12 PM  Result Value Ref Range   Color, Urine YELLOW YELLOW   APPearance CLEAR CLEAR   Specific Gravity, Urine 1.025 1.005 - 1.030   pH 6.0 5.0 - 8.0   Glucose, UA NEGATIVE NEGATIVE mg/dL   Hgb urine dipstick TRACE (A) NEGATIVE   Bilirubin Urine NEGATIVE NEGATIVE   Ketones, ur NEGATIVE NEGATIVE mg/dL   Protein, ur NEGATIVE NEGATIVE mg/dL   Nitrite NEGATIVE NEGATIVE   Leukocytes, UA NEGATIVE NEGATIVE  Urine microscopic-add on     Status: Abnormal    Collection Time: 10/30/15 10:12 PM  Result Value Ref Range   Squamous Epithelial / LPF 0-5 (A) NONE SEEN   WBC, UA NONE SEEN 0 - 5 WBC/hpf   RBC / HPF 0-5 0 - 5 RBC/hpf   Bacteria, UA NONE SEEN NONE SEEN     Imaging Review Ct Abdomen Pelvis W Contrast  10/30/2015  CLINICAL DATA:  Central abdominal pain starting a few hours ago. Vomiting. Right lower quadrant pain. EXAM: CT ABDOMEN AND PELVIS WITH CONTRAST TECHNIQUE: Multidetector CT imaging of the abdomen and pelvis was performed using the standard protocol following bolus administration of intravenous contrast. CONTRAST:  25mL OMNIPAQUE IOHEXOL 300 MG/ML SOLN, OMNIPAQUE IOHEXOL 300 MG/ML SOLN COMPARISON:  None. FINDINGS: Mild dependent atelectasis in the lung bases. Surgical absence of the gallbladder. Mild diffuse fatty infiltration of the liver. No bile duct dilatation. The pancreas, spleen, adrenal glands, kidneys, abdominal aorta, inferior vena cava, and retroperitoneal lymph nodes are unremarkable. Small accessory spleen. The stomach, small bowel, and colon are not abnormally distended. No free air or free fluid in the abdomen. Abdominal wall musculature appears intact. Pelvis: The appendix is normal in caliber and appearance. There is a small amount of infiltration in the fat around the tip of the appendix. This is likely related to pelvic fluid and not to inflammatory process. Small amount of free fluid in the pelvis and around the right ovary, likely physiologic. Uterus is surgically absent. Bladder wall is not thickened. No pelvic mass or lymphadenopathy. Postoperative changes with posterior fixation at L5-S1. No destructive bone lesions. IMPRESSION: Normal appendix. No evidence of bowel obstruction or inflammation. Small amount of fluid in the pelvis and right lower quadrant likely physiologic. Electronically Signed   By: Burman Nieves M.D.   On: 10/30/2015 23:44   Discussed this case with Dr. Effie Shy, since patient continues to  complain of  severe RLQ pain will call for ultrasound to look for torsion.   **Ultrasound called in to do pelvic ultrasound, patient went to ultrasound department and then refused the transvaginal exam. **  Patient returned to exam room, she had taken out her IV and then left the hospital.   I have personally reviewed and evaluated these images and lab results as part of my medical decision-making.   MDM  35 y.o. female with hx of chronic pain and attending a pain management clinic here with RLQ abdominal pain. Patient refused ultrasound and left AMA.   Final diagnoses:  RLQ abdominal pain       [facility], NP 10/31/15 0129  Mancel Bale, MD 11/01/15 [phone]

## 2015-10-31 ENCOUNTER — Telehealth (HOSPITAL_COMMUNITY): Payer: Self-pay

## 2015-10-31 ENCOUNTER — Emergency Department (HOSPITAL_COMMUNITY): Payer: Medicaid Other

## 2015-10-31 NOTE — ED Notes (Signed)
Pt told this nurse that she was told that they would call her for any abnormal results from her [place], however, I was just made aware that the pt refused her [place]. Pt walked out of the ED.

## 2015-10-31 NOTE — ED Notes (Signed)
Passed pt in hall fully clothed with purse and drink in hand, asked pt where she was going and she stated that she was taking her daughter to waiting room. Explained to pt that we could get family from waiting room to get her daughter but that she could not leave her room without being discharged, Pt had removed her own IV as well. Pt then mad and wanting to speak with charge nurse and refuses for this nurse to care for her anymore. Neldon Mc, RN charge nurse and Kerrie Buffalo, PA-C notified of this.

## 2015-11-03 ENCOUNTER — Encounter (HOSPITAL_COMMUNITY): Payer: Self-pay | Admitting: Emergency Medicine

## 2015-11-03 ENCOUNTER — Emergency Department (HOSPITAL_COMMUNITY): Payer: Medicaid Other

## 2015-11-03 ENCOUNTER — Emergency Department (HOSPITAL_COMMUNITY)
Admission: EM | Admit: 2015-11-03 | Discharge: 2015-11-03 | Disposition: A | Discharge status: Home / Self Care | Payer: Medicaid Other | Attending: Emergency Medicine | Admitting: Emergency Medicine

## 2015-11-03 DIAGNOSIS — R112 Nausea with vomiting, unspecified: Secondary | ICD-10-CM | POA: Insufficient documentation

## 2015-11-03 DIAGNOSIS — Z79899 Other long term (current) drug therapy: Secondary | ICD-10-CM | POA: Diagnosis not present

## 2015-11-03 DIAGNOSIS — R109 Unspecified abdominal pain: Secondary | ICD-10-CM

## 2015-11-03 DIAGNOSIS — R1031 Right lower quadrant pain: Secondary | ICD-10-CM

## 2015-11-03 DIAGNOSIS — F17210 Nicotine dependence, cigarettes, uncomplicated: Secondary | ICD-10-CM | POA: Insufficient documentation

## 2015-11-03 DIAGNOSIS — G8929 Other chronic pain: Secondary | ICD-10-CM | POA: Diagnosis not present

## 2015-11-03 DIAGNOSIS — Z880 Allergy status to penicillin: Secondary | ICD-10-CM | POA: Insufficient documentation

## 2015-11-03 LAB — CBC WITH DIFFERENTIAL/PLATELET
Basophils Absolute: 0 10*3/uL (ref 0.0–0.1)
Basophils Relative: 0 %
EOS PCT: 3 %
Eosinophils Absolute: 0.2 10*3/uL (ref 0.0–0.7)
HEMATOCRIT: 41.2 % (ref 36.0–46.0)
Hemoglobin: 13.8 g/dL (ref 12.0–15.0)
LYMPHS ABS: 3 10*3/uL (ref 0.7–4.0)
LYMPHS PCT: 38 %
MCH: 30.6 pg (ref 26.0–34.0)
MCHC: 33.5 g/dL (ref 30.0–36.0)
MCV: 91.4 fL (ref 78.0–100.0)
MONO ABS: 0.5 10*3/uL (ref 0.1–1.0)
Monocytes Relative: 6 %
NEUTROS ABS: 4.1 10*3/uL (ref 1.7–7.7)
Neutrophils Relative %: 53 %
PLATELETS: 205 10*3/uL (ref 150–400)
RBC: 4.51 MIL/uL (ref 3.87–5.11)
RDW: 12.8 % (ref 11.5–15.5)
WBC: 7.7 10*3/uL (ref 4.0–10.5)

## 2015-11-03 LAB — COMPREHENSIVE METABOLIC PANEL
ALBUMIN: 4.2 g/dL (ref 3.5–5.0)
ALT: 17 U/L (ref 14–54)
AST: 20 U/L (ref 15–41)
Alkaline Phosphatase: 47 U/L (ref 38–126)
Anion gap: 7 (ref 5–15)
BUN: 14 mg/dL (ref 6–20)
CHLORIDE: 108 mmol/L (ref 101–111)
CO2: 26 mmol/L (ref 22–32)
CREATININE: 0.88 mg/dL (ref 0.44–1.00)
Calcium: 9.3 mg/dL (ref 8.9–10.3)
GFR calc Af Amer: >60 mL/min (ref >60)
GFR calc non Af Amer: >60 mL/min (ref >60)
Glucose, Bld: 99 mg/dL (ref 65–99)
POTASSIUM: 4.1 mmol/L (ref 3.5–5.1)
SODIUM: 141 mmol/L (ref 135–145)
Total Bilirubin: 0.3 mg/dL (ref 0.3–1.2)
Total Protein: 6.8 g/dL (ref 6.5–8.1)

## 2015-11-03 LAB — LIPASE, BLOOD: Lipase: 20 U/L (ref 11–51)

## 2015-11-03 LAB — URINALYSIS, ROUTINE W REFLEX MICROSCOPIC
Bilirubin Urine: NEGATIVE
Glucose, UA: NEGATIVE mg/dL
Ketones, ur: NEGATIVE mg/dL
NITRITE: NEGATIVE
PROTEIN: NEGATIVE mg/dL
Specific Gravity, Urine: >1.03 — ABNORMAL HIGH (ref 1.005–1.030)
pH: 5.5 (ref 5.0–8.0)

## 2015-11-03 LAB — URINE MICROSCOPIC-ADD ON

## 2015-11-03 MED ORDER — FENTANYL CITRATE (PF) 100 MCG/2ML IJ SOLN
100.0000 ug | Freq: Once | INTRAMUSCULAR | Status: AC
  Administered 2015-11-03: 100 ug via INTRAMUSCULAR
  Filled 2015-11-03: qty 2

## 2015-11-03 MED ORDER — DICYCLOMINE HCL 20 MG PO TABS: 20.0000 mg | ORAL_TABLET | Freq: Four times a day (QID) | ORAL | Status: DC | PRN

## 2015-11-03 MED ORDER — PROMETHAZINE HCL 25 MG/ML IJ SOLN
25.0000 mg | Freq: Once | INTRAMUSCULAR | Status: AC
  Administered 2015-11-03: 25 mg via INTRAMUSCULAR
  Filled 2015-11-03: qty 1

## 2015-11-03 MED ORDER — PROMETHAZINE HCL 25 MG PO TABS: 25.0000 mg | ORAL_TABLET | Freq: Four times a day (QID) | ORAL | Status: DC | PRN

## 2015-11-03 NOTE — ED Notes (Signed)
Patient complaining of right upper abdominal pain and vomiting x 1 week. Patient was treated here on 10/30/2014 for same.

## 2015-11-03 NOTE — Discharge Instructions (Signed)
°Emergency Department Resource Guide °1) Find a Doctor and Pay Out of Pocket °Although you won't have to find out who is covered by your insurance plan, it is a good idea to ask around and get recommendations. You will then need to call the office and see if the doctor you have chosen will accept you as a new patient and what types of options they offer for patients who are self-pay. Some doctors offer discounts or will set up payment plans for their patients who do not have insurance, but you will need to ask so you aren't surprised when you get to your appointment. ° °2) Contact Your Local Health Department °Not all health departments have doctors that can see patients for sick visits, but many do, so it is worth a call to see if yours does. If you don't know where your local health department is, you can check in your phone book. The CDC also has a tool to help you locate your state's health department, and many state websites also have listings of all of their local health departments. ° °3) Find a Walk-in Clinic °If your illness is not likely to be very severe or complicated, you may want to try a walk in clinic. These are popping up all over the country in pharmacies, drugstores, and shopping centers. They're usually staffed by nurse practitioners or physician assistants that have been trained to treat common illnesses and complaints. They're usually fairly quick and inexpensive. However, if you have serious medical issues or chronic medical problems, these are probably not your best option. ° °No Primary Care Doctor: °- Call [facility] at  832-8000 - they can help you locate a primary care doctor that  accepts your insurance, provides certain services, etc. °- Physician Referral Service- 1-800-533-3463 ° °Chronic Pain Problems: °Organization         Address  Phone   Notes  °[facility]  (336) 297-2271 Patients need to be referred by their primary care doctor.  ° °Medication  Assistance: °Organization         Address  Phone   Notes  °[place] [facility] [address] °[place], [place] [address] (336) 641-8030 --Must be a resident of [place] °-- Must have NO insurance coverage whatsoever (no Medicaid/ Medicare, etc.) °-- The pt. MUST have a primary care doctor that directs their care regularly and follows them in the community °  °[facility]  (866) 331-1348   °[employer]  (888) 892-1162   ° °Agencies that provide inexpensive medical care: °Organization         Address  Phone   Notes  °[facility]  (336) 832-8035   °[facility]    (336) 832-7272   °[facility] [address] °[place], [place] [address] (336) 832-4777   °[facility] [address], °[place] (336) 271-4999   °[facility]    (336) 373-0678   °[facility]    (336) 272-1050   °[facility] ° [address], [place] Phone:  (336) 832-4444, Fax:  (336) 832-4440 Hours of Operation:  9 am - 6 pm, M-F.  Also accepts Medicaid/Medicare and self-pay.  °[facility] ° [address], [place] Phone: (336) [phone], Fax: (336) [phone]. Hours of Operation:  8:30 am - 5:30 pm, M-F.  Also accepts Medicaid and self-pay.  °[facility] [place] [address], [place] Phone: (336) 878-6027   °[facility] [address], [place], [place] (336)723-1848, Ext. 123 Mondays & Thursdays: 7-9 AM.  First 15 patients are seen on a first come, first serve basis. °  ° °Medicaid-accepting [place] Providers: ° °Organization         Address  Phone   Notes  °[facility] [address], [place] (336) [phone] Also accepts self-pay patients.  °[facility] [address], [place] ° (336) 856-9996   °[facility] [address], [place]  (336) 288-8857   °[facility] [address] [place] [address], [place] (336) 299-7000   °Veita Bland [address], [place]  ° (336) 373-1557 Only accepts [place] Access Medicaid patients after they have their name applied to their card.  ° °Self-Pay (no insurance) in [place]: ° °Organization         Address  Phone   Notes  °Sickle Cell Patients, [facility] [address], [place] (336) 832-1970   °[facility] [address], [place] (336) 832-4400   °[facility] ° [address], [place] (336) 992-4800   °[facility]/Dr. Osei-Bonsu ° [address] [place] [address], [place] or [address], [place] (336) 841-8500 Phone number for both [place] and [place] locations is the same.  °[facility] [address], [place] (336) 299-0000   °[facility] [address] [place] [address], [place] or [address] (336) 852-7530 °(336) 878-2260   °[facility] [address], [place] (336) 350-1642, phone; (336) 294-5005, fax Sees patients 1st and 3rd Saturday of every month.  Must not qualify for public or private insurance (i.e. Medicaid, Medicare, [place] Health Choice, Veterans' Benefits) • Household income should be no more than 200% of the poverty level •The clinic cannot treat you if you are pregnant or think you are pregnant • Sexually transmitted diseases are not treated at the clinic.  ° ° °Dental Care: °Organization         Address  Phone  Notes  °[place] [facility] [address], [place] (336) 641-6152 Accepts children up to age 21 who are enrolled in Medicaid or [place] Health Choice; pregnant women with a Medicaid card; and children who have applied for Medicaid or [place] Health Choice, but were declined, whose parents can pay a reduced fee at time of service.  °[place]  [facility] [place]  [address], [place] (336) 641-7733 Accepts children up to age 21 who are enrolled in Medicaid or [place] Health Choice; pregnant women with a Medicaid card; and children who have applied for Medicaid or [place] Health Choice, but were declined, whose parents can pay a reduced fee at time of service.  °[facility] ° [address], [place] (336) 641-4533 Patients are seen by appointment only. Walk-ins are not accepted. [facility] will see patients 18 years of age and older. °Monday - Tuesday (8am-5pm) °Most Wednesdays (8:30-5pm) °$30 per visit, cash only  °[facility] ° [address], [place] (336) 641-4533 Patients are seen by appointment only. Walk-ins are not accepted. [facility] will see patients 18 years of age and older. °One   Wednesday Evening (Monthly: Volunteer Based).  $30 per visit, cash only  °[facility]  (919) 537-3737 for adults; Children under age 4, call Graduate Pediatric Dentistry at ([phone]) [phone]. Children aged 4-14, please call ([phone]) [phone] to request a pediatric application. ° Dental services are provided in all areas of dental care including fillings, crowns and bridges, complete and partial dentures, implants, gum treatment, root canals, and extractions. Preventive care is also provided. Treatment is provided to both adults and children. °Patients are selected via a lottery and there is often a waiting list. °  °[facility] [address], °[place] ° (336) 763-8833 [email or web address] °  °[facility] [address], [place], [place] (336)723-1848, Ext. 123 Second and Fourth Thursday of each month, opens at 6:30 AM; Clinic ends at 9 AM.  Patients are seen on a first-come first-served basis, and a limited number are seen during each clinic.  ° °[facility] ° [address], [place], [place] (336) 723-7904    Eligibility Requirements °You must have lived in [place], or [place] counties for at least the last three months. °  You cannot be eligible for state or federal sponsored healthcare insurance, including Veterans Administration, Medicaid, or Medicare. °  You generally cannot be eligible for healthcare insurance through your employer.  °  How to apply: °Eligibility screenings are held every Tuesday and Wednesday afternoon from 1:00 pm until 4:00 pm. You do not need an appointment for the interview!  °[facility] [address], [place], [place] 336-631-2330   °[facility]  336-342-8273   °[facility]  336-703-3100   °[facility]  336-570-6415   ° °Behavioral Health Resources in the Community: °Intensive Outpatient Programs °Organization         Address  Phone  Notes  °[facility] [address], [place], [place] 336-878-6098   °[facility] [address], [place], [place] 336-832-9800   °[facility] [address], [place], [place] ° 336-882-2125   °[facility] [address],  °[place], [place] 1-800-853-5163 or 336-641-4981   °Substance Abuse Resources °Organization         Address  Phone  Notes  °[facility]  336-882-2125   °[facility]  336-784-9470   °[facility]  336-285-9073   °[facility]  336-845-3988   °Residential & Outpatient Substance Abuse Program  1-800-659-3381   °Psychological Services °Organization         Address  Phone  Notes  °[facility]  336- 832-9600   °[facility]  336- 378-7881   °[facility] [address], [place] 1-800-853-5163 or 336-641-4981   ° °Mobile Crisis Teams °Organization         Address  Phone  Notes  °[facility], Mobile Crisis Care Unit  1-877-626-1772   °Assertive °[facility] ° [address]  [place], [place] 336-834-9664   °Sharon DeEsch [address] °[place] 336-554-5454   ° °Self-Help/Support Groups °Organization         Address  Phone             Notes  °[facility]  - variety of support groups  336- 373-1402 Call for more information  °Narcotics Anonymous (NA), [facility] [address], °[place]  2 meetings at this location  ° °  Residential Treatment Programs Organization         Address  Phone  Notes  [facility] [address],    [place]  1-610-960-4540   [facility]  [address], [place] [address], [place], [place] 478-295-6213   [facility] [address] [place], [place] 086-578-4696 Admissions: 8am-3pm M-F  [facility] [address],    [place], [place] 295-284-1324   [facility] [address] [place], [place], [place] 401-027-2536   [facility] [address]  [place], [place] 644-034-7425   [facility] Intensive Outpatient [address], [place], [place] 956-387-5643   [facility] [address] [place]  [place], [place] 3-295-188-4166 or 215-470-4251   [facility] [address] [place], [place] 323-557-3220 Accepts Medicaid  [facility] [address]  [place] 2-542-706-2376 Substance Abuse/Addiction Treatment   [facility]         Address  Phone  Notes  [facility]  2817300266   Angie Fava, PhD [address] [place], [place]   (204)804-1794 or 417-775-0377   [facility]   [address] [place], [place] (930) 880-5301   [facility] [address] [place], [place] 443-254-7624 Insurance/Medicaid/sponsorship through [facility] and Families [address]                                    [place], [place] [phone] Therapy/tele-psych/case    [facility] [address], [place] 815-544-3234    Dr. Lolly Mustache  754-075-6673   [facility]  [employer] [facility] Dept. 1) [address], [place] 2) [address] [place] 3)  [address], [place] [phone] (915)[phone]  (713)349-8025   [facility] Child Abuse Hotline 2151163286 or 423-027-4228 (After Hours)      Take the prescriptions as directed.  Apply moist heat to the area(s) of discomfort, for 15 minutes at a time, several times per day for the next few days.  Do not fall asleep on a heating pack.  Call your regular medical doctor and your OB/GYN doctor tomorrow to schedule a follow up appointment in the next 2 days.  Return to the Emergency Department immediately if worsening.

## 2015-11-03 NOTE — ED Provider Notes (Signed)
CSN: [id number]     Arrival date & time 11/03/15  1855 History   First MD Initiated Contact with Patient 11/03/15 1913     Chief Complaint  Patient presents with  . Abdominal Pain      HPI Pt was seen at 1915. Per pt, c/o gradual onset and worsening of persistent RLQ abd "pain" for the past 5 days. Has been associated with N/V. Pt was evaluated in the ED 4 days ago for these symptoms, but refused transvaginal US to evaluate her ovaries and left AMA. Pt states her symptoms continue, and she now is agreeable to have transvaginal US performed. Denies dysuria/hematuria, no vaginal bleeding/discharge, no flank/back pain, no diarrhea, no fevers, no rash, no black or blood in stools or emesis.    Past Medical History  Diagnosis Date  . Pain management   . Chronic back pain    Past Surgical History  Procedure Laterality Date  . Back surgery    . Cholecystectomy    . Tooth extraction    . Abdominal hysterectomy      partial    Social History  Substance Use Topics  . Smoking status: Current Every Day Smoker -- 1.00 packs/day    Types: Cigarettes  . Smokeless tobacco: Never Used  . Alcohol Use: No   OB History    Gravida Para Term Preterm AB TAB SAB Ectopic Multiple Living   Review of Systems ROS: Statement: All systems negative except as marked or noted in the HPI; Constitutional: Negative for fever and chills. ; ; Eyes: Negative for eye pain, redness and discharge. ; ; ENMT: Negative for ear pain, hoarseness, nasal congestion, sinus pressure and sore throat. ; ; Cardiovascular: Negative for chest pain, palpitations, diaphoresis, dyspnea and peripheral edema. ; ; Respiratory: Negative for cough, wheezing and stridor. ; ; Gastrointestinal: +N/V, abd pain. Negative for diarrhea, blood in stool, hematemesis, jaundice and rectal bleeding. . ; ; Genitourinary: Negative for dysuria, flank pain and hematuria. ; ; GYN:  No vaginal bleeding, no vaginal discharge, no vulvar  pain. ;; Musculoskeletal: Negative for back pain and neck pain. Negative for swelling and trauma.; ; Skin: Negative for pruritus, rash, abrasions, blisters, bruising and skin lesion.; ; Neuro: Negative for headache, lightheadedness and neck stiffness. Negative for weakness, altered level of consciousness , altered mental status, extremity weakness, paresthesias, involuntary movement, seizure and syncope.      Allergies  Penicillins; Codeine; and Toradol  Home Medications   Prior to Admission medications   Medication Sig Start Date End Date Taking? Authorizing Provider  acetaminophen (TYLENOL) 500 MG tablet Take 500-1,500 mg by mouth every 6 (six) hours as needed for mild pain or moderate pain.     Historical Provider, MD  albuterol (PROVENTIL HFA;VENTOLIN HFA) 108 (90 BASE) MCG/ACT inhaler Inhale 2 puffs into the lungs every 6 (six) hours as needed for wheezing or shortness of breath.    Historical Provider, MD  amoxicillin (AMOXIL) 500 MG capsule Take 1 capsule (500 mg total) by mouth 3 (three) times daily. Patient not taking: Reported on 06/27/2015 06/19/15   Roxy Horseman, PA-C  amoxicillin (AMOXIL) 500 MG capsule Take 1 capsule (500 mg total) by mouth 3 (three) times daily. Patient not taking: Reported on 09/14/2015 08/29/15   Raeford Razor, MD  azithromycin (ZITHROMAX) 250 MG tablet 2 tabs day one, then 1 tab daily. Patient not taking: Reported on 08/29/2015 06/27/15   Link Snuffer  Beverely Pace, PA-C  citalopram (CELEXA) 40 MG tablet Take 40 mg by mouth daily.    Historical Provider, MD  dexamethasone (DECADRON) 4 MG tablet Take 1 tablet (4 mg total) by mouth 2 (two) times daily with a meal. Patient not taking: Reported on 08/29/2015 06/27/15   Ivery Quale, PA-C  estrogens, conjugated, (PREMARIN) 1.25 MG tablet Take 1.25 mg by mouth at bedtime.     Historical Provider, MD  HYDROcodone-homatropine (HYCODAN) 5-1.5 MG/5ML syrup Take 5 mLs by mouth every 6 (six) hours as needed. Patient not taking:  Reported on 08/29/2015 06/27/15   Ivery Quale, PA-C  medroxyPROGESTERone (DEPO-PROVERA) 150 MG/ML injection Inject 150 mg into the muscle every 3 (three) months.    Historical Provider, MD  metoCLOPramide (REGLAN) 10 MG tablet Take 1 tablet (10 mg total) by mouth every 6 (six) hours as needed for nausea (or headache). Patient not taking: Reported on 10/30/2015 09/14/15   Samuel Jester, DO  nitrofurantoin, macrocrystal-monohydrate, (MACROBID) 100 MG capsule Take 1 capsule (100 mg total) by mouth 2 (two) times daily. Patient not taking: Reported on 10/30/2015 09/14/15   Samuel Jester, DO  traMADol (ULTRAM) 50 MG tablet Take 1 tablet (50 mg total) by mouth every 6 (six) hours as needed. Patient not taking: Reported on 10/30/2015 08/29/15   Raeford Razor, MD   BP 109/68 mmHg  Pulse 76  Temp(Src) 98.6 F (37 C) (Oral)  Resp 20  Wt 181 lb (82.101 kg)  SpO2 100% Physical Exam  1920: Physical examination:  Nursing notes reviewed; Vital signs and O2 SAT reviewed;  Constitutional: Well developed, Well nourished, Well hydrated, Leaning head and upper body over side of stretcher, gagging.; Head:  Normocephalic, atraumatic; Eyes: EOMI, PERRL, No scleral icterus; ENMT: Mouth and pharynx normal, Mucous membranes moist; Neck: Supple, Full range of motion, No lymphadenopathy; Cardiovascular: Regular rate and rhythm, No murmur, rub, or gallop; Respiratory: Breath sounds clear & equal bilaterally, No rales, rhonchi, wheezes.  Speaking full sentences with ease, Normal respiratory effort/excursion; Chest: Nontender, Movement normal; Abdomen: Soft, +RLQ tenderness to palp. Nondistended, Normal bowel sounds; Genitourinary: No CVA tenderness; Extremities: Pulses normal, No tenderness, No edema, No calf edema or asymmetry.; Neuro: AA&Ox3, Major CN grossly intact.  Speech clear. No gross focal motor or sensory deficits in extremities.; Skin: Color normal, Warm, Dry.    ED Course  Procedures (including critical care  time) Labs Review  Imaging Review  I have personally reviewed and evaluated these images and lab results as part of my medical decision-making.   EKG Interpretation None      MDM  MDM Reviewed: previous chart, nursing note and vitals Reviewed previous: labs, CT scan and ultrasound Interpretation: labs, CT scan and ultrasound     Results for orders placed or performed during the hospital encounter of 11/03/15  Urinalysis, Routine w reflex microscopic  Result Value Ref Range   Color, Urine YELLOW YELLOW   APPearance CLEAR CLEAR   Specific Gravity, Urine >1.030 (H) 1.005 - 1.030   pH 5.5 5.0 - 8.0   Glucose, UA NEGATIVE NEGATIVE mg/dL   Hgb urine dipstick SMALL (A) NEGATIVE   Bilirubin Urine NEGATIVE NEGATIVE   Ketones, ur NEGATIVE NEGATIVE mg/dL   Protein, ur NEGATIVE NEGATIVE mg/dL   Nitrite NEGATIVE NEGATIVE   Leukocytes, UA SMALL (A) NEGATIVE  Comprehensive metabolic panel  Result Value Ref Range   Sodium 141 135 - 145 mmol/L   Potassium 4.1 3.5 - 5.1 mmol/L   Chloride 108 101 - 111 mmol/L  CO2 26 22 - 32 mmol/L   Glucose, Bld 99 65 - 99 mg/dL   BUN 14 6 - 20 mg/dL   Creatinine, Ser 1.61 0.44 - 1.00 mg/dL   Calcium 9.3 8.9 - 09.6 mg/dL   Total Protein 6.8 6.5 - 8.1 g/dL   Albumin 4.2 3.5 - 5.0 g/dL   AST 20 15 - 41 U/L   ALT 17 14 - 54 U/L   Alkaline Phosphatase 47 38 - 126 U/L   Total Bilirubin 0.3 0.3 - 1.2 mg/dL   GFR calc non Af Amer >60 >60 mL/min   GFR calc Af Amer >60 >60 mL/min   Anion gap 7 5 - 15  Lipase, blood  Result Value Ref Range   Lipase 20 11 - 51 U/L  CBC with Differential  Result Value Ref Range   WBC 7.7 4.0 - 10.5 K/uL   RBC 4.51 3.87 - 5.11 MIL/uL   Hemoglobin 13.8 12.0 - 15.0 g/dL   HCT 04.5 40.9 - 81.1 %   MCV 91.4 78.0 - 100.0 fL   MCH 30.6 26.0 - 34.0 pg   MCHC 33.5 30.0 - 36.0 g/dL   RDW 91.4 78.2 - 95.6 %   Platelets 205 150 - 400 K/uL   Neutrophils Relative % 53 %   Neutro Abs 4.1 1.7 - 7.7 K/uL   Lymphocytes Relative  38 %   Lymphs Abs 3.0 0.7 - 4.0 K/uL   Monocytes Relative 6 %   Monocytes Absolute 0.5 0.1 - 1.0 K/uL   Eosinophils Relative 3 %   Eosinophils Absolute 0.2 0.0 - 0.7 K/uL   Basophils Relative 0 %   Basophils Absolute 0.0 0.0 - 0.1 K/uL  Urine microscopic-add on  Result Value Ref Range   Squamous Epithelial / LPF 6-30 (A) NONE SEEN   WBC, UA 6-30 0 - 5 WBC/hpf   RBC / HPF 0-5 0 - 5 RBC/hpf   Bacteria, UA FEW (A) NONE SEEN   US Transvaginal Non-ob 11/03/2015  CLINICAL DATA:  Right lower quadrant pain for 4 days. History of hysterectomy and removal of the left ovary. EXAM: TRANSABDOMINAL AND TRANSVAGINAL ULTRASOUND OF PELVIS DOPPLER ULTRASOUND OF OVARIES TECHNIQUE: Both transabdominal and transvaginal ultrasound examinations of the pelvis were performed. Transabdominal technique was performed for global imaging of the pelvis including uterus, ovaries, adnexal regions, and pelvic cul-de-sac. It was necessary to proceed with endovaginal exam following the transabdominal exam to visualize the right adnexa. Color and duplex Doppler ultrasound was utilized to evaluate blood flow to the ovaries. COMPARISON:  CT abdomen and pelvis earlier today. Pelvic ultrasound 10/31/2015. FINDINGS: Uterus Removed. Endometrium Not applicable. Right ovary Not visualized Left ovary Removed. Pulsed Doppler evaluation is not applicable as the ovaries are not seen. Other findings No abnormal free fluid. IMPRESSION: The uterus and left ovary have been removed. The right ovary is not visualized. Note is made the right ovary is not visualized on the CT scan earlier today. No acute abnormality. Electronically Signed   By: Drusilla Kanner M.D.   On: 11/03/2015 20:41   Ct Renal Stone Study 11/03/2015  CLINICAL DATA:  Right lower quadrant pain for 1 week. Subsequent encounter. EXAM: CT ABDOMEN AND PELVIS WITHOUT CONTRAST TECHNIQUE: Multidetector CT imaging of the abdomen and pelvis was performed following the standard protocol without  IV contrast. COMPARISON:  CT abdomen and pelvis 10/30/2015. Pelvic ultrasound 10/31/2015. FINDINGS: Mild dependent atelectasis is seen in the lung bases. There is cardiomegaly. No pleural or pericardial effusion. The  patient is status post cholecystectomy. The liver, spleen, adrenal glands, pancreas and left kidney are unremarkable. Two punctate nonobstructing stones are seen in the lower pole of the right kidney. These not visible on the prior exam. The appendix is well seen and appears normal. The stomach and small and large bowel are unremarkable. The patient is status post hysterectomy. No lymphadenopathy or fluid. Tiny amount of free pelvic fluid seen on the prior CT has resolved. No lytic or sclerotic bony lesion is seen. The patient is status post L5-S1 fusion. IMPRESSION: No acute abnormality or finding to explain the patient's symptoms. The appendix appears normal. Two punctate nonobstructing right renal stones. Electronically Signed   By: Drusilla Kanner M.D.   On: 11/03/2015 20:01   US Pelvis Complete 10/31/2015  CLINICAL DATA:  Right lower quadrant pain. History of partial hysterectomy. EXAM: TRANSABDOMINAL ULTRASOUND OF PELVIS TECHNIQUE: Transabdominal ultrasound examination of the pelvis was performed including evaluation of the uterus, ovaries, adnexal regions, and pelvic cul-de-sac. COMPARISON:  CT abdomen and pelvis 10/30/2015 FINDINGS: Uterus Uterus is surgically absent. Endometrium Surgically absent. Right ovary Right ovary is not visualized. Left ovary Left ovary is not visualized. Other findings:  No abnormal free fluid. Patient refused transvaginal imaging. No Doppler imaging was performed. IMPRESSION: Uterus is surgically absent.  Ovaries are not visualized. Electronically Signed   By: Burman Nieves M.D.   On: 10/31/2015 01:23   Ct Abdomen Pelvis W Contrast 10/30/2015  CLINICAL DATA:  Central abdominal pain starting a few hours ago. Vomiting. Right lower quadrant pain. EXAM: CT ABDOMEN  AND PELVIS WITH CONTRAST TECHNIQUE: Multidetector CT imaging of the abdomen and pelvis was performed using the standard protocol following bolus administration of intravenous contrast. CONTRAST:  25mL OMNIPAQUE IOHEXOL 300 MG/ML SOLN, OMNIPAQUE IOHEXOL 300 MG/ML SOLN COMPARISON:  None. FINDINGS: Mild dependent atelectasis in the lung bases. Surgical absence of the gallbladder. Mild diffuse fatty infiltration of the liver. No bile duct dilatation. The pancreas, spleen, adrenal glands, kidneys, abdominal aorta, inferior vena cava, and retroperitoneal lymph nodes are unremarkable. Small accessory spleen. The stomach, small bowel, and colon are not abnormally distended. No free air or free fluid in the abdomen. Abdominal wall musculature appears intact. Pelvis: The appendix is normal in caliber and appearance. There is a small amount of infiltration in the fat around the tip of the appendix. This is likely related to pelvic fluid and not to inflammatory process. Small amount of free fluid in the pelvis and around the right ovary, likely physiologic. Uterus is surgically absent. Bladder wall is not thickened. No pelvic mass or lymphadenopathy. Postoperative changes with posterior fixation at L5-S1. No destructive bone lesions. IMPRESSION: Normal appendix. No evidence of bowel obstruction or inflammation. Small amount of fluid in the pelvis and right lower quadrant likely physiologic. Electronically Signed   By: Burman Nieves M.D.   On: 10/30/2015 23:44    2105:  Udip contaminated. Wet prep and GC/chlam from previous ED visit: results pending. Workup today without clear cause for pt's pain. CT scan with normal appendix and resolved tiny free fluid seen on prior CT. [place] again unable to visualized ovaries. Doubt torsion as cause for her symptoms for the past week. Requesting more pain meds and different/stronger pain meds. Pt with multiple drug allergies and already on chronic narcotic pain meds. Offered re-dose  IM fentanyl; pt prefers to leave. Dx and testing d/w pt and family.  Questions answered.  Verb understanding, agreeable to d/c home with outpt f/u.  Samuel Jester, DO 11/06/15 1005

## 2016-01-03 ENCOUNTER — Emergency Department (HOSPITAL_COMMUNITY)
Admission: EM | Admit: 2016-01-03 | Discharge: 2016-01-03 | Disposition: A | Discharge status: Home / Self Care | Payer: Medicaid Other | Attending: Emergency Medicine | Admitting: Emergency Medicine

## 2016-01-03 ENCOUNTER — Encounter (HOSPITAL_COMMUNITY): Payer: Self-pay | Admitting: Emergency Medicine

## 2016-01-03 DIAGNOSIS — Y939 Activity, unspecified: Secondary | ICD-10-CM | POA: Diagnosis not present

## 2016-01-03 DIAGNOSIS — S81851A Open bite, right lower leg, initial encounter: Secondary | ICD-10-CM | POA: Insufficient documentation

## 2016-01-03 DIAGNOSIS — Y929 Unspecified place or not applicable: Secondary | ICD-10-CM | POA: Diagnosis not present

## 2016-01-03 DIAGNOSIS — Y999 Unspecified external cause status: Secondary | ICD-10-CM | POA: Insufficient documentation

## 2016-01-03 DIAGNOSIS — Z79899 Other long term (current) drug therapy: Secondary | ICD-10-CM | POA: Diagnosis not present

## 2016-01-03 DIAGNOSIS — F17210 Nicotine dependence, cigarettes, uncomplicated: Secondary | ICD-10-CM | POA: Diagnosis not present

## 2016-01-03 DIAGNOSIS — Z792 Long term (current) use of antibiotics: Secondary | ICD-10-CM | POA: Insufficient documentation

## 2016-01-03 DIAGNOSIS — W57XXXA Bitten or stung by nonvenomous insect and other nonvenomous arthropods, initial encounter: Secondary | ICD-10-CM | POA: Diagnosis not present

## 2016-01-03 MED ORDER — HYDROCODONE-ACETAMINOPHEN 5-325 MG PO TABS: 1.0000 | ORAL_TABLET | ORAL | Status: DC | PRN

## 2016-01-03 MED ORDER — DOXYCYCLINE HYCLATE 100 MG PO CAPS: 100.0000 mg | ORAL_CAPSULE | Freq: Two times a day (BID) | ORAL | Status: DC

## 2016-01-03 NOTE — ED Provider Notes (Signed)
CSN: [id number]     Arrival date & time 01/03/16  1140 History   First MD Initiated Contact with Patient 01/03/16 1333     Chief Complaint  Patient presents with  . Insect Bite     (Consider location/radiation/quality/duration/timing/severity/associated sxs/prior Treatment) HPI Comments: Patient is a 35 year old female who presents to the emergency department with a complaint of a bite to her right leg.  The patient states that approximately 24 hours ago she sustained a bite to the inner aspect of the right lower leg, just above the ankle. She states that she now has pain that is 8 on a scale of 1-10. She says that the muscle just above the bite area is very sore. She has some pain when she attempts to walk. She denies any history of diabetes. She denies any history of any peripheral vascular problems. She has not had any high fever since the bite. She has no problem that affects her immune system. She presents now for evaluation, as she and her husband feel that the spider may have been "brown recluse".  The history is provided by the patient.    Past Medical History  Diagnosis Date  . Pain management   . Chronic back pain    Past Surgical History  Procedure Laterality Date  . Back surgery    . Cholecystectomy    . Tooth extraction    . Abdominal hysterectomy      partial   History reviewed. No pertinent family history. Social History  Substance Use Topics  . Smoking status: Current Every Day Smoker -- 1.00 packs/day    Types: Cigarettes  . Smokeless tobacco: Never Used  . Alcohol Use: No   OB History    Gravida Para Term Preterm AB TAB SAB Ectopic Multiple Living   3 3 3       2      Review of Systems  Constitutional: Negative for activity change.       All ROS Neg except as noted in HPI  HENT: Negative for nosebleeds.   Eyes: Negative for photophobia and discharge.  Respiratory: Negative for cough, shortness of breath and wheezing.   Cardiovascular: Negative for  chest pain and palpitations.  Gastrointestinal: Negative for abdominal pain and blood in stool.  Genitourinary: Negative for dysuria, frequency and hematuria.  Musculoskeletal: Positive for myalgias. Negative for back pain and neck pain.  Skin: Negative.   Neurological: Negative for dizziness, seizures and speech difficulty.  Psychiatric/Behavioral: Negative for hallucinations and confusion. The patient is nervous/anxious.   All other systems reviewed and are negative.     Allergies  Penicillins; Codeine; and Toradol  Home Medications   Prior to Admission medications   Medication Sig Start Date End Date Taking? Authorizing Provider  acetaminophen (TYLENOL) 500 MG tablet Take 500-1,500 mg by mouth every 6 (six) hours as needed for mild pain or moderate pain.     Historical Provider, MD  albuterol (PROVENTIL HFA;VENTOLIN HFA) 108 (90 BASE) MCG/ACT inhaler Inhale 2 puffs into the lungs every 6 (six) hours as needed for wheezing or shortness of breath.    Historical Provider, MD  amoxicillin (AMOXIL) 500 MG capsule Take 1 capsule (500 mg total) by mouth 3 (three) times daily. Patient not taking: Reported on 06/27/2015 06/19/15   Roxy Horsemanobert Browning, PA-C  amoxicillin (AMOXIL) 500 MG capsule Take 1 capsule (500 mg total) by mouth 3 (three) times daily. Patient not taking: Reported on 09/14/2015 08/29/15   Raeford RazorStephen Kohut, MD  azithromycin [facility](ZITHROMAX) 250  MG tablet 2 tabs day one, then 1 tab daily. Patient not taking: Reported on 08/29/2015 06/27/15   Ivery Quale, PA-C  citalopram (CELEXA) 40 MG tablet Take 40 mg by mouth daily.    Historical Provider, MD  dexamethasone (DECADRON) 4 MG tablet Take 1 tablet (4 mg total) by mouth 2 (two) times daily with a meal. Patient not taking: Reported on 08/29/2015 06/27/15   Ivery Quale, PA-C  dicyclomine (BENTYL) 20 MG tablet Take 1 tablet (20 mg total) by mouth every 6 (six) hours as needed for spasms (abdominal cramping). 11/03/15   Samuel Jester, DO    estrogens, conjugated, (PREMARIN) 1.25 MG tablet Take 1.25 mg by mouth at bedtime.     Historical Provider, MD  HYDROcodone-homatropine (HYCODAN) 5-1.5 MG/5ML syrup Take 5 mLs by mouth every 6 (six) hours as needed. Patient not taking: Reported on 08/29/2015 06/27/15   Ivery Quale, PA-C  medroxyPROGESTERone (DEPO-PROVERA) 150 MG/ML injection Inject 150 mg into the muscle every 3 (three) months.    Historical Provider, MD  metoCLOPramide (REGLAN) 10 MG tablet Take 1 tablet (10 mg total) by mouth every 6 (six) hours as needed for nausea (or headache). Patient not taking: Reported on 10/30/2015 09/14/15   Samuel Jester, DO  nitrofurantoin, macrocrystal-monohydrate, (MACROBID) 100 MG capsule Take 1 capsule (100 mg total) by mouth 2 (two) times daily. Patient not taking: Reported on 10/30/2015 09/14/15   Samuel Jester, DO  promethazine (PHENERGAN) 25 MG tablet Take 1 tablet (25 mg total) by mouth every 6 (six) hours as needed for nausea or vomiting. 11/03/15   Samuel Jester, DO  traMADol (ULTRAM) 50 MG tablet Take 1 tablet (50 mg total) by mouth every 6 (six) hours as needed. Patient not taking: Reported on 10/30/2015 08/29/15   Raeford Razor, MD   BP 126/63 mmHg  Pulse 70  Temp(Src) 97.7 F (36.5 C) (Oral)  Resp 16  Ht  (1.651 m)  Wt 90.719 kg  BMI 33.28 kg/m2  SpO2 100% Physical Exam  Constitutional: She is oriented to person, place, and time. She appears well-developed and well-nourished.  Non-toxic appearance.  HENT:  Head: Normocephalic.  Right Ear: Tympanic membrane and external ear normal.  Left Ear: Tympanic membrane and external ear normal.  Eyes: EOM and lids are normal. Pupils are equal, round, and reactive to light.  Neck: Normal range of motion. Neck supple. Carotid bruit is not present.  Cardiovascular: Normal rate, regular rhythm, normal heart sounds, intact distal pulses and normal pulses.   Pulmonary/Chest: Breath sounds normal. No respiratory distress.   Abdominal: Soft. Bowel sounds are normal. There is no tenderness. There is no guarding.  Musculoskeletal: Normal range of motion.       Right lower leg: She exhibits tenderness. She exhibits no edema.       Legs: Lymphadenopathy:       Head (right side): No submandibular adenopathy present.       Head (left side): No submandibular adenopathy present.    She has no cervical adenopathy.  Neurological: She is alert and oriented to person, place, and time. She has normal strength. No cranial nerve deficit or sensory deficit.  Skin: Skin is warm and dry.  Psychiatric: She has a normal mood and affect. Her speech is normal.  Nursing note and vitals reviewed.   ED Course  Procedures (including critical care time) Labs Review Labs Reviewed - No data to display  Imaging Review No results found. I have personally reviewed and evaluated these images and lab  results as part of my medical decision-making.   EKG Interpretation None      MDM  The patient has a slightly raised red area just above the medial aspect of the right ankle. There no red streaks appreciated. The area is not hot. The patient states that the Muscle is sore. There is no ulceration appreciated. There is no neurovascular compromise of the lower extremity.  I've asked the patient to soak the area in warm Epsom salt water daily. The patient is placed prophylactically on doxycycline. The patient is to have the area rechecked on Friday, March 31, by the primary physician, or return to the emergency department if that cannot be arranged. The patient is also given instructions to return if any high fever, or worsening of her general condition.    Final diagnoses:  Insect bite    **I have reviewed nursing notes, vital signs, and all appropriate lab and imaging results for this patient.Ivery Quale, PA-C 01/03/16 1410  Zadie Rhine, MD 01/03/16 1540  Zadie Rhine, MD 01/03/16 1540

## 2016-01-03 NOTE — ED Notes (Signed)
Insect bite to right inner ankle.  Rates pain 8/10.

## 2016-01-03 NOTE — Discharge Instructions (Signed)
Please soak your affected leg in warm Epsom salt water for 15-20 minutes daily. Please have your leg rechecked on Friday, March 31 by your primary physician, or if they cannot see you please return to the emergency department. Please use doxycycline 2 times daily. Use Tylenol or ibuprofen for mild pain, use Norco for more severe pain. Norco may cause drowsiness, please use with caution. Please keep your leg elevated above your waist is much as possible.

## 2016-01-22 ENCOUNTER — Emergency Department (HOSPITAL_COMMUNITY)
Admission: EM | Admit: 2016-01-22 | Discharge: 2016-01-22 | Discharge status: Against Medical Advice | Payer: Medicaid Other | Attending: Emergency Medicine | Admitting: Emergency Medicine

## 2016-01-22 ENCOUNTER — Encounter (HOSPITAL_COMMUNITY): Payer: Self-pay | Admitting: *Deleted

## 2016-01-22 DIAGNOSIS — Y939 Activity, unspecified: Secondary | ICD-10-CM | POA: Diagnosis not present

## 2016-01-22 DIAGNOSIS — Y999 Unspecified external cause status: Secondary | ICD-10-CM | POA: Insufficient documentation

## 2016-01-22 DIAGNOSIS — S80861A Insect bite (nonvenomous), right lower leg, initial encounter: Secondary | ICD-10-CM | POA: Diagnosis present

## 2016-01-22 DIAGNOSIS — Y929 Unspecified place or not applicable: Secondary | ICD-10-CM | POA: Insufficient documentation

## 2016-01-22 DIAGNOSIS — W57XXXA Bitten or stung by nonvenomous insect and other nonvenomous arthropods, initial encounter: Secondary | ICD-10-CM | POA: Diagnosis not present

## 2016-01-22 DIAGNOSIS — Z79899 Other long term (current) drug therapy: Secondary | ICD-10-CM | POA: Diagnosis not present

## 2016-01-22 DIAGNOSIS — F17210 Nicotine dependence, cigarettes, uncomplicated: Secondary | ICD-10-CM | POA: Insufficient documentation

## 2016-01-22 MED ORDER — IBUPROFEN 800 MG PO TABS
800.0000 mg | ORAL_TABLET | Freq: Once | ORAL | Status: AC
  Administered 2016-01-22: 800 mg via ORAL
  Filled 2016-01-22: qty 1

## 2016-01-22 NOTE — ED Provider Notes (Signed)
CSN: [id number]     Arrival date & time 01/22/16  1551 History   By signing my name below, I, Evon Slack, attest that this documentation has been prepared under the direction and in the presence of [employer], PA-C. Electronically Signed: Evon Slack, ED Scribe. 01/22/2016. 4:32 PM.     Chief Complaint  Patient presents with  . Insect Bite   The history is provided by the patient. No language interpreter was used.   HPI Comments: Alyssa Graham is a 35 y.o. female who presents to the Emergency Department complaining of insect bite onset 1 month prior. Pt reports feeling as if something popped while resting today and now is having burning pain around the bite. Pt reports that she is having myalgias in her calf and right lower leg and reports intermittent sweating followed by cold chills since today worsened pain began one hour before arriving here. Pt states she was initially bit by a spider. Pt denies any medications PTA. Pt states she was evaluated in the ED 1 month prior and she received antibiotics and pain medication. Pt states she has been compliant with the antibiotics. Pt denies drainage from the area or fever.     Past Medical History  Diagnosis Date  . Pain management   . Chronic back pain    Past Surgical History  Procedure Laterality Date  . Back surgery    . Cholecystectomy    . Tooth extraction    . Abdominal hysterectomy      partial   No family history on file. Social History  Substance Use Topics  . Smoking status: Current Every Day Smoker -- 1.00 packs/day    Types: Cigarettes  . Smokeless tobacco: Never Used  . Alcohol Use: No   OB History    Gravida Para Term Preterm AB TAB SAB Ectopic Multiple Living   Review of Systems  Constitutional: Negative for fever and chills.  HENT: Negative.   Respiratory: Negative.   Cardiovascular: Negative.   Gastrointestinal: Negative.   Musculoskeletal: Positive for myalgias.  Skin:  Positive for wound.  Neurological: Negative.   Hematological: Negative.      Allergies  Penicillins; Codeine; and Toradol  Home Medications   Prior to Admission medications   Medication Sig Start Date End Date Taking? Authorizing Provider  acetaminophen (TYLENOL) 500 MG tablet Take 500-1,500 mg by mouth every 6 (six) hours as needed for mild pain or moderate pain.    Yes Historical Provider, MD  albuterol (PROVENTIL HFA;VENTOLIN HFA) 108 (90 BASE) MCG/ACT inhaler Inhale 2 puffs into the lungs every 6 (six) hours as needed for wheezing or shortness of breath.   Yes Historical Provider, MD  citalopram (CELEXA) 40 MG tablet Take 40 mg by mouth daily.   Yes Historical Provider, MD  dicyclomine (BENTYL) 20 MG tablet Take 1 tablet (20 mg total) by mouth every 6 (six) hours as needed for spasms (abdominal cramping). Patient not taking: Reported on 01/22/2016 11/03/15   Samuel Jester, DO  doxycycline (VIBRAMYCIN) 100 MG capsule Take 1 capsule (100 mg total) by mouth 2 (two) times daily. Patient not taking: Reported on 01/22/2016 01/03/16   Ivery Quale, PA-C  HYDROcodone-acetaminophen (NORCO/VICODIN) 5-325 MG tablet Take 1 tablet by mouth every 4 (four) hours as needed. Patient not taking: Reported on 01/22/2016 01/03/16   Ivery Quale, PA-C   BP 107/44 mmHg  Pulse 73  Temp(Src) 98 F (  36.7 C) (Tympanic)  Resp 18  Ht 5\' 5"  (1.651 m)  Wt 99.791 kg  BMI 36.61 kg/m2  SpO2 99%   Physical Exam  Constitutional: She is oriented to person, place, and time. She appears well-developed and well-nourished. No distress.  HENT:  Head: Normocephalic and atraumatic.  Eyes: Conjunctivae and EOM are normal.  Neck: Neck supple. No tracheal deviation present.  Cardiovascular: Normal rate.   Pulmonary/Chest: Effort normal. No respiratory distress.  Musculoskeletal: Normal range of motion.  Calf soft but tender to palpation, no induration.   Neurological: She is alert and oriented to person, place, and  time.  Skin: Skin is warm and dry.  3mm erythematous macule which is soft without induration or fluctuance no red streaking at her right lower medial leg. Pain with palpation around macule, but otherwise normal exam.   Psychiatric: She has a normal mood and affect. Her behavior is normal.  Nursing note and vitals reviewed.   ED Course  Procedures (including critical care time) DIAGNOSTIC STUDIES: Oxygen Saturation is 99% on RA, normal by my interpretation.    COORDINATION OF CARE: 4:09 PM-Discussed treatment plan with pt at bedside and pt agreed to plan.     Labs Review Labs Reviewed  CBC WITH DIFFERENTIAL/PLATELET  BASIC METABOLIC PANEL    Imaging Review No results found.    EKG Interpretation None      MDM   Final diagnoses:  None    With pain out of proportion to findings,  Blood work ordered including wbc and bmet.  She refused this bloodwork.  While formulating treatment plan, pt left dept without informing providers.    I personally performed the services described in this documentation, which was scribed in my presence. The recorded information has been reviewed and is accurate.     Burgess AmorJulie Wendy Hoback, PA-C 01/22/16 1700  Mancel BaleElliott Wentz, MD 01/22/16 2352

## 2016-01-22 NOTE — ED Notes (Signed)
Pt states she was seen x1 ago for a spider bite and was given antibiotics and a shot. Pt states she woke up with that area tender to touch.

## 2016-01-22 NOTE — ED Notes (Signed)
Pt left AMA without signing. 

## 2016-03-21 ENCOUNTER — Encounter (HOSPITAL_COMMUNITY): Payer: Self-pay | Admitting: Emergency Medicine

## 2016-03-21 ENCOUNTER — Emergency Department (HOSPITAL_COMMUNITY)
Admission: EM | Admit: 2016-03-21 | Discharge: 2016-03-21 | Disposition: A | Discharge status: Home / Self Care | Payer: Medicaid Other | Attending: Emergency Medicine | Admitting: Emergency Medicine

## 2016-03-21 DIAGNOSIS — R21 Rash and other nonspecific skin eruption: Secondary | ICD-10-CM | POA: Diagnosis present

## 2016-03-21 DIAGNOSIS — Y939 Activity, unspecified: Secondary | ICD-10-CM | POA: Diagnosis not present

## 2016-03-21 DIAGNOSIS — W57XXXA Bitten or stung by nonvenomous insect and other nonvenomous arthropods, initial encounter: Secondary | ICD-10-CM | POA: Diagnosis not present

## 2016-03-21 DIAGNOSIS — Y999 Unspecified external cause status: Secondary | ICD-10-CM | POA: Diagnosis not present

## 2016-03-21 DIAGNOSIS — Y929 Unspecified place or not applicable: Secondary | ICD-10-CM | POA: Insufficient documentation

## 2016-03-21 DIAGNOSIS — F17210 Nicotine dependence, cigarettes, uncomplicated: Secondary | ICD-10-CM | POA: Insufficient documentation

## 2016-03-21 DIAGNOSIS — Z5321 Procedure and treatment not carried out due to patient leaving prior to being seen by health care provider: Secondary | ICD-10-CM | POA: Diagnosis not present

## 2016-03-21 NOTE — ED Notes (Signed)
Patient states she is going home to try something on her bites, will return if necessary

## 2016-03-21 NOTE — ED Notes (Signed)
Pt c/o bites all over her body x 3 days. Pt states they burn and itch.

## 2016-03-25 ENCOUNTER — Encounter (HOSPITAL_COMMUNITY): Payer: Self-pay | Admitting: Emergency Medicine

## 2016-03-25 ENCOUNTER — Emergency Department (HOSPITAL_COMMUNITY): Payer: Medicaid Other

## 2016-03-25 ENCOUNTER — Emergency Department (HOSPITAL_COMMUNITY)
Admission: EM | Admit: 2016-03-25 | Discharge: 2016-03-25 | Disposition: A | Discharge status: Home / Self Care | Payer: Medicaid Other | Attending: Emergency Medicine | Admitting: Emergency Medicine

## 2016-03-25 DIAGNOSIS — M25471 Effusion, right ankle: Secondary | ICD-10-CM

## 2016-03-25 DIAGNOSIS — F17210 Nicotine dependence, cigarettes, uncomplicated: Secondary | ICD-10-CM | POA: Insufficient documentation

## 2016-03-25 DIAGNOSIS — M25571 Pain in right ankle and joints of right foot: Secondary | ICD-10-CM | POA: Diagnosis present

## 2016-03-25 MED ORDER — IBUPROFEN 800 MG PO TABS
800.0000 mg | ORAL_TABLET | Freq: Once | ORAL | Status: DC
  Filled 2016-03-25: qty 1

## 2016-03-25 NOTE — ED Provider Notes (Signed)
CSN: [id number]     Arrival date & time 03/25/16  1821 History   First MD Initiated Contact with Patient 03/25/16 1921     Chief Complaint  Patient presents with  . Leg Pain     (Consider location/radiation/quality/duration/timing/severity/associated sxs/prior Treatment) HPI  35 year old female presents with right ankle pain and foot pain. Started yesterday while at [facility]. Does not remember specific injury. Her ankle is more swollen yesterday. Pain now radiates up into her calf and knee. Has not noticed any calf swelling. No weakness or numbness. Has taken ibuprofen and Tylenol without any relief. Has not noticed any redness or fevers. Patient tells me she is allergic to Toradol although she can take ibuprofen and has been taking 800 mg ibuprofen.  Past Medical History  Diagnosis Date  . Pain management   . Chronic back pain    Past Surgical History  Procedure Laterality Date  . Back surgery    . Cholecystectomy    . Tooth extraction    . Abdominal hysterectomy      partial   History reviewed. No pertinent family history. Social History  Substance Use Topics  . Smoking status: Current Every Day Smoker -- 1.00 packs/day    Types: Cigarettes  . Smokeless tobacco: Never Used  . Alcohol Use: No   OB History    Gravida Para Term Preterm AB TAB SAB Ectopic Multiple Living   Review of Systems  Constitutional: Negative for fever.  Cardiovascular: Negative for leg swelling.  Musculoskeletal: Positive for joint swelling and arthralgias.  Skin: Positive for color change.  Neurological: Negative for weakness and numbness.  All other systems reviewed and are negative.     Allergies  Penicillins; Codeine; and Toradol  Home Medications   Prior to Admission medications   Medication Sig Start Date End Date Taking? Authorizing Provider  acetaminophen (TYLENOL) 500 MG tablet Take 500-1,500 mg by mouth every 6 (six) hours as needed for mild pain or moderate  pain.     Historical Provider, MD  albuterol (PROVENTIL HFA;VENTOLIN HFA) 108 (90 BASE) MCG/ACT inhaler Inhale 2 puffs into the lungs every 6 (six) hours as needed for wheezing or shortness of breath.    Historical Provider, MD  citalopram (CELEXA) 40 MG tablet Take 40 mg by mouth daily.    Historical Provider, MD  dicyclomine (BENTYL) 20 MG tablet Take 1 tablet (20 mg total) by mouth every 6 (six) hours as needed for spasms (abdominal cramping). Patient not taking: Reported on 01/22/2016 11/03/15   Samuel Jester, DO  doxycycline (VIBRAMYCIN) 100 MG capsule Take 1 capsule (100 mg total) by mouth 2 (two) times daily. Patient not taking: Reported on 01/22/2016 01/03/16   Ivery Quale, PA-C  HYDROcodone-acetaminophen (NORCO/VICODIN) 5-325 MG tablet Take 1 tablet by mouth every 4 (four) hours as needed. Patient not taking: Reported on 01/22/2016 01/03/16   Ivery Quale, PA-C   BP 109/53 mmHg  Pulse 81  Temp(Src) 98.1 F (36.7 C) (Oral)  Resp 12  Ht  (1.651 m)  Wt 220 lb (99.791 kg)  BMI 36.61 kg/m2  SpO2 100% Physical Exam  Constitutional: She is oriented to person, place, and time. She appears well-developed and well-nourished.  HENT:  Head: Normocephalic and atraumatic.  Right Ear: External ear normal.  Left Ear: External ear normal.  Nose: Nose normal.  Eyes: Right eye exhibits no discharge. Left eye exhibits no discharge.  Cardiovascular: Normal rate,  regular rhythm and normal heart sounds.   Pulses:      Dorsalis pedis pulses are 2+ on the right side, and 2+ on the left side.  Pulmonary/Chest: Effort normal.  Abdominal: She exhibits no distension.  Musculoskeletal:       Right knee: She exhibits normal range of motion and no swelling. No tenderness found.       Right ankle: She exhibits normal range of motion and no swelling. Tenderness. Lateral malleolus and medial malleolus tenderness found. Achilles tendon normal. Achilles tendon exhibits no pain and no defect.       Right  lower leg: She exhibits no tenderness and no swelling.       Right foot: There is tenderness (proximal) and swelling (mild, proximal).  Neurological: She is alert and oriented to person, place, and time.  Skin: Skin is warm and dry.  Nursing note and vitals reviewed.   ED Course  Procedures (including critical care time) Labs Review Labs Reviewed - No data to display  Imaging Review Dg Ankle Complete Right  03/25/2016  CLINICAL DATA:  Acute onset of right foot and ankle pain. Initial encounter. EXAM: RIGHT ANKLE - COMPLETE 3+ VIEW COMPARISON:  None. FINDINGS: There is no evidence of fracture or dislocation. The ankle mortise is intact; the interosseous space is within normal limits. No talar tilt or subluxation is seen. A plantar calcaneal spur is noted. The joint spaces are preserved. No significant soft tissue abnormalities are seen. IMPRESSION: No evidence of fracture or dislocation. Electronically Signed   By: Roanna RaiderJeffery  Chang M.D.   On: 03/25/2016 20:20   Dg Foot Complete Right  03/25/2016  CLINICAL DATA:  Acute onset of right foot and ankle pain. Initial encounter. EXAM: RIGHT FOOT COMPLETE - 3+ VIEW COMPARISON:  None. FINDINGS: There is no evidence of fracture or dislocation. The joint spaces are preserved. There is no evidence of talar subluxation; the subtalar joint is unremarkable in appearance. A tiny os peroneum is noted. A plantar calcaneal spur is seen. No significant soft tissue abnormalities are seen. IMPRESSION: 1. No evidence of fracture or dislocation. 2. Tiny os peroneum noted. Electronically Signed   By: Roanna RaiderJeffery  Chang M.D.   On: 03/25/2016 20:20   I have personally reviewed and evaluated these images and lab results as part of my medical decision-making.   EKG Interpretation None      MDM   Final diagnoses:  Right ankle swelling    Patient's x-rays are unremarkable. She has normal range of motion of her ankle. NV intact. Mild swelling but I highly doubt an  infectious/septic process. Pain radiates up her calf but there is no calf tenderness or swelling. This seems to be localized to the ankle. Likely a sprain. Continue NSAIDs and Tylenol. Will recommend elevation and ice. Follow-up with PCP and/or orthopedics if not improving. Discussed return precautions.    Pricilla LovelessScott Haydan Mansouri, MD 03/25/16 2043

## 2016-03-25 NOTE — ED Notes (Signed)
Pt states her right ankle began hurting last night and is now radiating up right calf.  Denies injury.

## 2016-03-25 NOTE — ED Notes (Signed)
Walked out prior to receiving discharge instructions

## 2016-03-25 NOTE — ED Notes (Signed)
States she had taken motrin prior to arrival

## 2016-04-12 ENCOUNTER — Encounter (HOSPITAL_COMMUNITY): Payer: Self-pay | Admitting: Emergency Medicine

## 2016-04-12 ENCOUNTER — Emergency Department (HOSPITAL_COMMUNITY)
Admission: EM | Admit: 2016-04-12 | Discharge: 2016-04-12 | Disposition: A | Discharge status: Home / Self Care | Payer: Medicaid Other | Attending: Emergency Medicine | Admitting: Emergency Medicine

## 2016-04-12 DIAGNOSIS — H6121 Impacted cerumen, right ear: Secondary | ICD-10-CM | POA: Diagnosis not present

## 2016-04-12 DIAGNOSIS — Z79899 Other long term (current) drug therapy: Secondary | ICD-10-CM | POA: Insufficient documentation

## 2016-04-12 DIAGNOSIS — F17210 Nicotine dependence, cigarettes, uncomplicated: Secondary | ICD-10-CM | POA: Insufficient documentation

## 2016-04-12 DIAGNOSIS — H65191 Other acute nonsuppurative otitis media, right ear: Secondary | ICD-10-CM | POA: Diagnosis not present

## 2016-04-12 DIAGNOSIS — H9201 Otalgia, right ear: Secondary | ICD-10-CM | POA: Diagnosis present

## 2016-04-12 MED ORDER — TRAMADOL HCL 50 MG PO TABS: 50.0000 mg | ORAL_TABLET | Freq: Four times a day (QID) | ORAL | Status: DC | PRN

## 2016-04-12 MED ORDER — CARBAMIDE PEROXIDE 6.5 % OT SOLN
5.0000 [drp] | Freq: Once | OTIC | Status: AC
  Administered 2016-04-12: 5 [drp] via OTIC
  Filled 2016-04-12: qty 15

## 2016-04-12 MED ORDER — AZITHROMYCIN 250 MG PO TABS: 250.0000 mg | ORAL_TABLET | Freq: Every day | ORAL | Status: AC

## 2016-04-12 NOTE — ED Notes (Signed)
Right ear irrigated with warm water after Debrox placed.  Pt reports pain with irrigation, and continued right ear pain after completion.  Ear canal now clear and TM visible.  No redness noted.  Will have provider reevaluate.

## 2016-04-12 NOTE — ED Notes (Signed)
Right Ear pain, with yellow and green drainage. Today onset of sore throat

## 2016-04-12 NOTE — Discharge Instructions (Signed)
Otitis Media, Adult Otitis media is redness, soreness, and puffiness (swelling) in the space just behind your eardrum (middle ear). It may be caused by allergies or infection. It often happens along with a cold. HOME CARE  Take your medicine as told. Finish it even if you start to feel better.  Only take over-the-counter or prescription medicines for pain, discomfort, or fever as told by your doctor.  Follow up with your doctor as told. GET HELP IF:  You have otitis media only in one ear, or bleeding from your nose, or both.  You notice a lump on your neck.  You are not getting better in 3-5 days.  You feel worse instead of better. GET HELP RIGHT AWAY IF:   You have pain that is not helped with medicine.  You have puffiness, redness, or pain around your ear.  You get a stiff neck.  You cannot move part of your face (paralysis).  You notice that the bone behind your ear hurts when you touch it. MAKE SURE YOU:   Understand these instructions.  Will watch your condition.  Will get help right away if you are not doing well or get worse.   This information is not intended to replace advice given to you by your health care provider. Make sure you discuss any questions you have with your health care provider.   Document Released: 03/12/2008 Document Revised: 10/15/2014 Document Reviewed: 04/21/2013 Elsevier Interactive Patient Education 2016 Elsevier Inc.  Cerumen Impaction The structures of the external ear canal secrete a waxy substance known as cerumen. Excess cerumen can build up in the ear canal, causing a condition known as cerumen impaction. Cerumen impaction can cause ear pain and disrupt the function of the ear. The rate of cerumen production differs for each individual. In certain individuals, the configuration of the ear canal may decrease his or her ability to naturally remove cerumen. CAUSES Cerumen impaction is caused by excessive cerumen production or  buildup. RISK FACTORS  Frequent use of swabs to clean ears.  Having narrow ear canals.  Having eczema.  Being dehydrated. SIGNS AND SYMPTOMS  Diminished hearing.  Ear drainage.  Ear pain.  Ear itch. TREATMENT Treatment may involve:  Over-the-counter or prescription ear drops to soften the cerumen.  Removal of cerumen by a health care provider. This may be done with:  Irrigation with warm water. This is the most common method of removal.  Ear curettes and other instruments.  Surgery. This may be done in severe cases. HOME CARE INSTRUCTIONS  Take medicines only as directed by your health care provider.  Do not insert objects into the ear with the intent of cleaning the ear. PREVENTION  Do not insert objects into the ear, even with the intent of cleaning the ear. Removing cerumen as a part of normal hygiene is not necessary, and the use of swabs in the ear canal is not recommended.  Drink enough water to keep your urine clear or pale yellow.  Control your eczema if you have it. SEEK MEDICAL CARE IF:  You develop ear pain.  You develop bleeding from the ear.  The cerumen does not clear after you use ear drops as directed.   This information is not intended to replace advice given to you by your health care provider. Make sure you discuss any questions you have with your health care provider.   Document Released: 11/01/2004 Document Revised: 10/15/2014 Document Reviewed: 05/11/2015 Elsevier Interactive Patient Education [employer]2016 Elsevier Inc.

## 2016-04-13 NOTE — ED Provider Notes (Signed)
CSN: [id number]     Arrival date & time 04/12/16  1353 History   First MD Initiated Contact with Patient 04/12/16 1453     Chief Complaint  Patient presents with  . Ear Drainage     (Consider location/radiation/quality/duration/timing/severity/associated sxs/prior Treatment) HPI   Alyssa Graham is a 35 y.o. female who presents to the Emergency Department complaining of Pain and drainage of the right ear and sore throat.  Your pain is been worsening for 2 days. Onset of throat pain several hours prior to arrival. She describes a throbbing pain to her ear with decreased hearing.  She has taken ibuprofen without relief. She denies trauma, fever, chills, cough and neck pain. Nothing makes the symptoms better or worse.   Past Medical History  Diagnosis Date  . Pain management   . Chronic back pain    Past Surgical History  Procedure Laterality Date  . Back surgery    . Cholecystectomy    . Tooth extraction    . Abdominal hysterectomy      partial   No family history on file. Social History  Substance Use Topics  . Smoking status: Current Every Day Smoker -- 1.00 packs/day    Types: Cigarettes  . Smokeless tobacco: Never Used  . Alcohol Use: No   OB History    Gravida Para Term Preterm AB TAB SAB Ectopic Multiple Living   3 3 3       2      Review of Systems  Constitutional: Negative for fever, chills and appetite change.  HENT: Positive for ear discharge, ear pain, hearing loss and sore throat. Negative for congestion, facial swelling and trouble swallowing.   Gastrointestinal: Negative for nausea, vomiting and abdominal pain.  Musculoskeletal: Negative for neck pain and neck stiffness.  Skin: Negative for rash.  Neurological: Negative for dizziness, numbness and headaches.  All other systems reviewed and are negative.     Allergies  Penicillins; Codeine; and Toradol  Home Medications   Prior to Admission medications   Medication Sig Start Date End Date  Taking? Authorizing Provider  acetaminophen (TYLENOL) 500 MG tablet Take 500-1,500 mg by mouth every 6 (six) hours as needed for mild pain or moderate pain.    Yes Historical Provider, MD  albuterol (PROVENTIL HFA;VENTOLIN HFA) 108 (90 BASE) MCG/ACT inhaler Inhale 2 puffs into the lungs every 6 (six) hours as needed for wheezing or shortness of breath.   Yes Historical Provider, MD  amphetamine-dextroamphetamine (ADDERALL) 20 MG tablet Take 20 mg by mouth 2 (two) times daily.   Yes Historical Provider, MD  citalopram (CELEXA) 40 MG tablet Take 40 mg by mouth daily.   Yes Historical Provider, MD  azithromycin (ZITHROMAX) 250 MG tablet Take 1 tablet (250 mg total) by mouth daily. Take first 2 tablets together, then 1 every day until finished. 04/12/16   Deletha Jaffee, PA-C  traMADol (ULTRAM) 50 MG tablet Take 1 tablet (50 mg total) by mouth every 6 (six) hours as needed. 04/12/16   Dez Stauffer, PA-C   BP 108/82 mmHg  Pulse 76  Temp(Src) 97.9 F (36.6 C) (Oral)  Resp 16  Ht 5\' 5"  (1.651 m)  Wt 95.709 kg  BMI 35.11 kg/m2  SpO2 100% Physical Exam  Constitutional: She is oriented to person, place, and time. She appears well-developed and well-nourished. No distress.  HENT:  Head: Normocephalic and atraumatic.  Right Ear: No drainage. No mastoid tenderness. Decreased hearing is noted.  Left Ear: Tympanic membrane and ear  canal normal.  Mouth/Throat: Uvula is midline, oropharynx is clear and moist and mucous membranes are normal. No uvula swelling. No oropharyngeal exudate.  Cerumen of the right auditory canal. TM not visualized.  Neck: Normal range of motion. Neck supple.  Cardiovascular: Normal rate, regular rhythm, normal heart sounds and intact distal pulses.   No murmur heard. Pulmonary/Chest: Effort normal and breath sounds normal. No stridor. No respiratory distress.  Lymphadenopathy:    She has no cervical adenopathy.  Neurological: She is alert and oriented to person, place, and time.  Coordination normal.  Skin: Skin is warm and dry. No rash noted.  Nursing note and vitals reviewed.   ED Course  Procedures (including critical care time) Labs Review Labs Reviewed - No data to display  Imaging Review No results found. I have personally reviewed and evaluated these images and lab results as part of my medical decision-making.   EKG Interpretation None      MDM   Final diagnoses:  Cerumen impaction, right  Acute nonsuppurative otitis media of right ear    Debrox applied, right ear canal was then irrigated by nursing staff with good resolution. TM now visualized, and appears dull with loss of landmarks. Pain improving. No perforation.  Stable for d/c    Alyssa Ausammy Lailynn Southgate, PA-C 04/13/16 2102  Alyssa BerkshireJoseph Zammit, MD 04/15/16 1740

## 2016-08-10 IMAGING — DX DG ANKLE COMPLETE 3+V*R*
3 series · 3 of 3 positions shown · non-contrast
Comparison: None.

CLINICAL DATA: Acute onset of right foot and ankle pain. Initial
encounter.

EXAM:
RIGHT ANKLE - COMPLETE 3+ VIEW

[ankle ap]
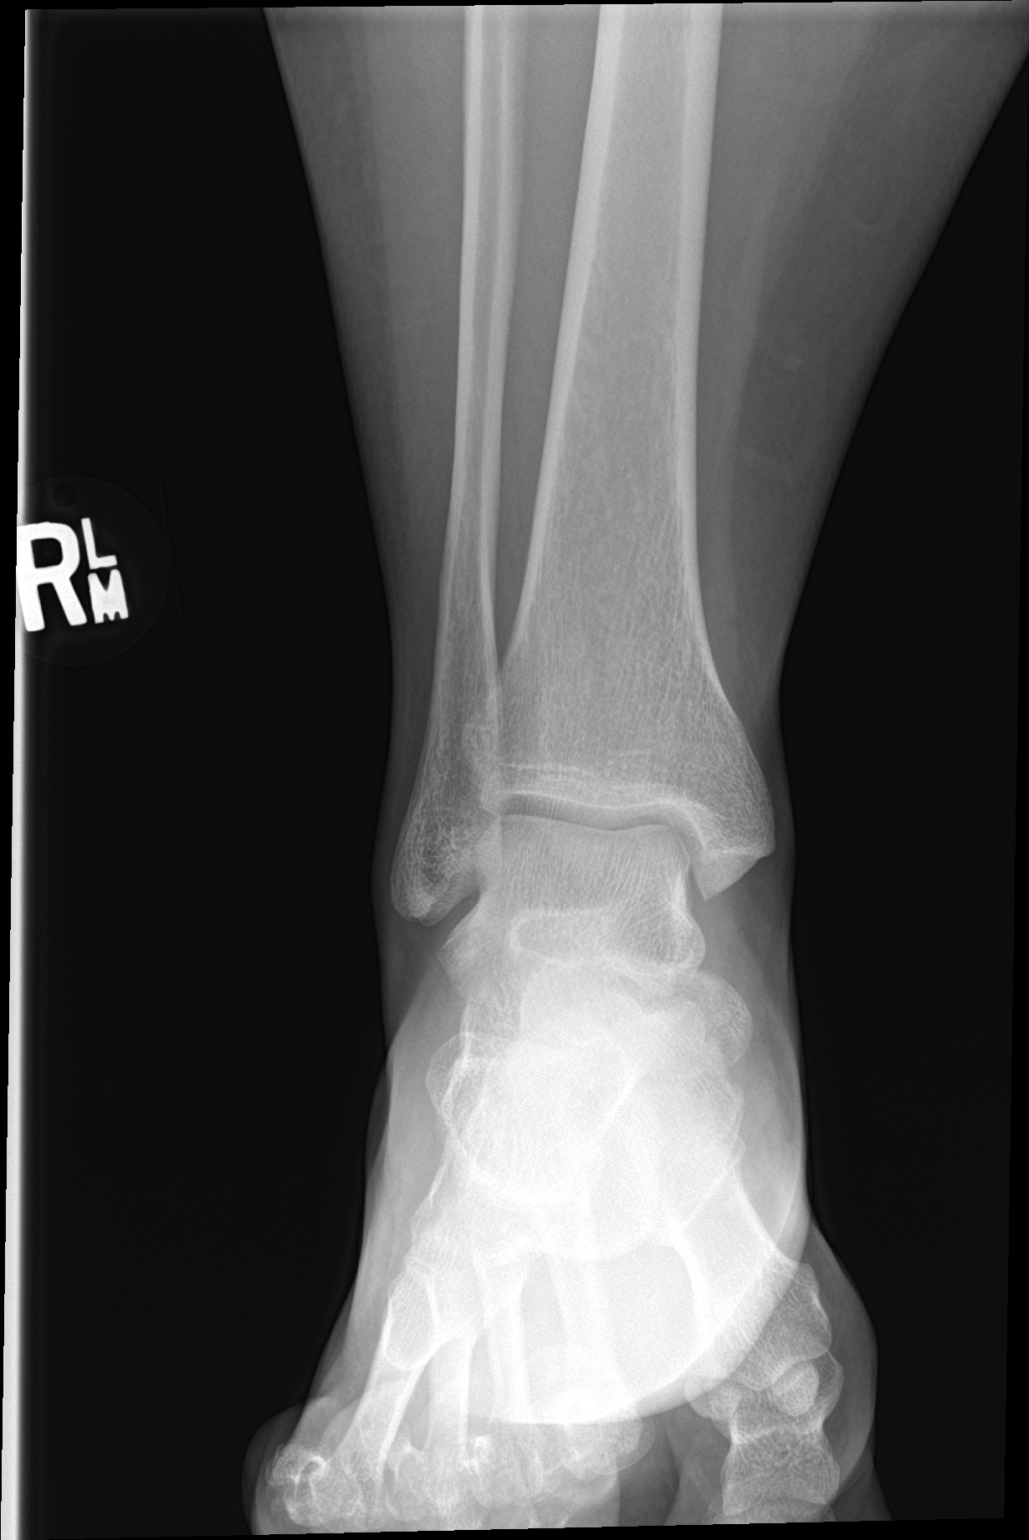

[ankle lat]
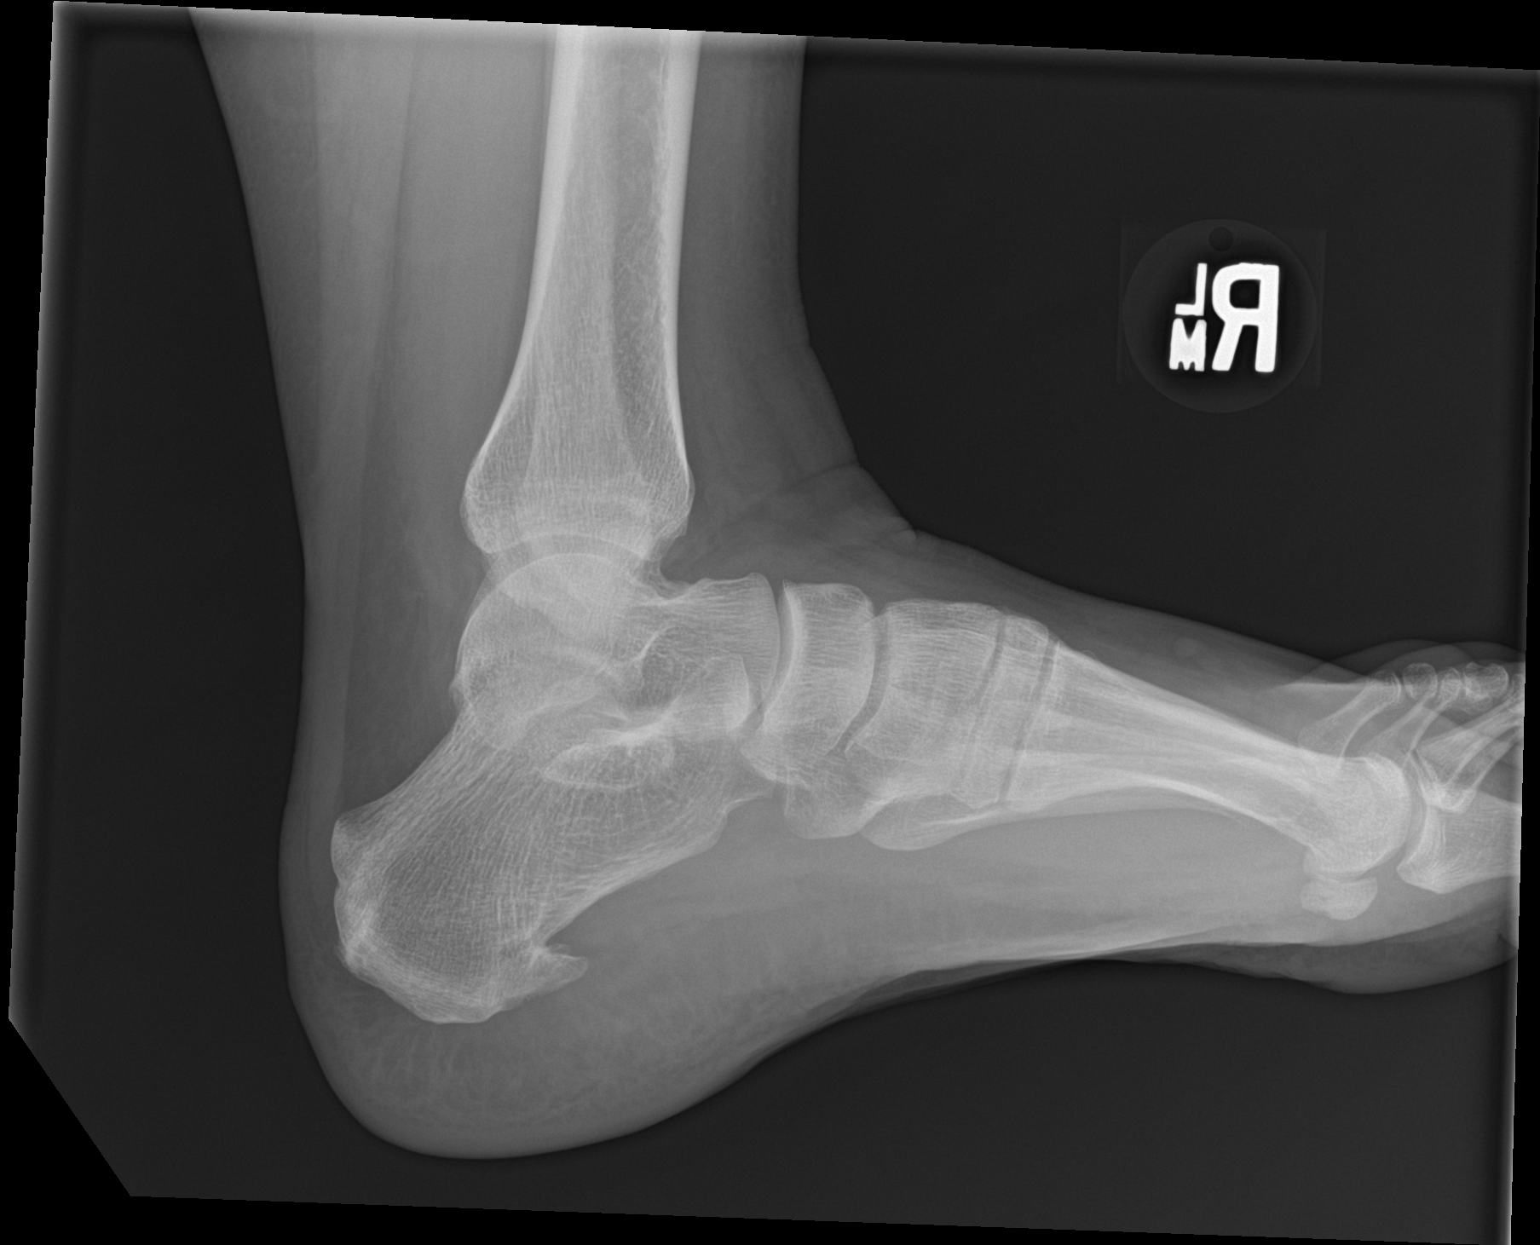

[ankle obl]
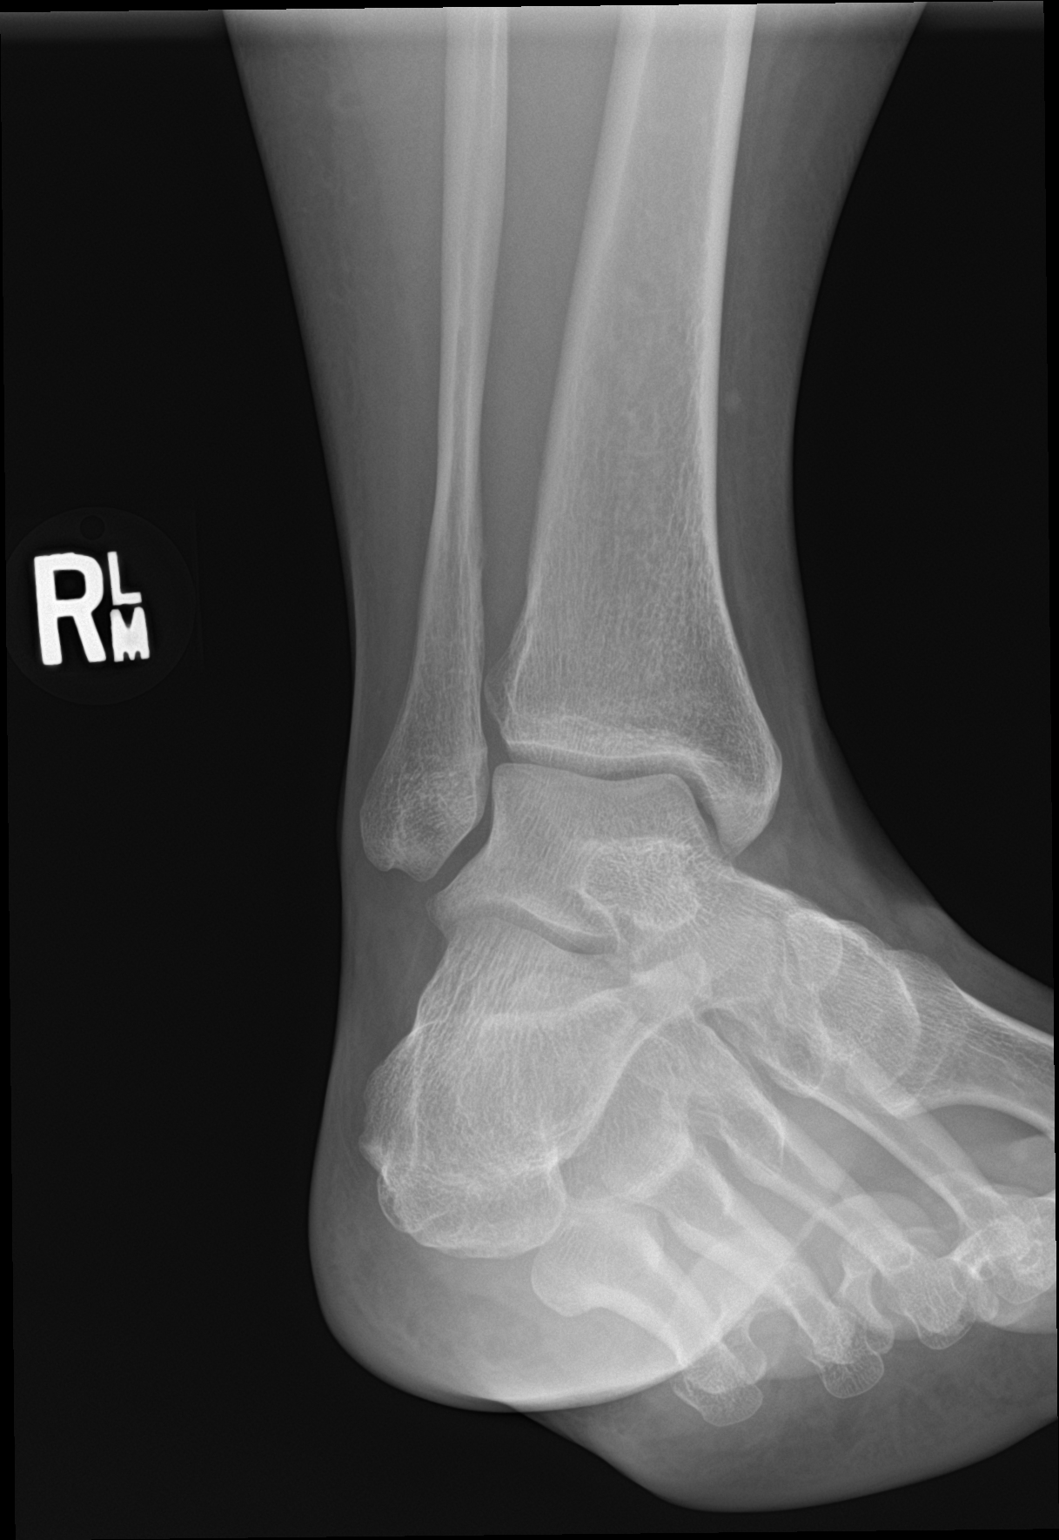

[3 of 3 positions shown; findings below may reference images not displayed]

FINDINGS: There is no evidence of fracture or dislocation. The ankle mortise
is intact; the interosseous space is within normal limits. No talar
tilt or subluxation is seen. A plantar calcaneal spur is noted.

The joint spaces are preserved. No significant soft tissue
abnormalities are seen.
IMPRESSION: No evidence of fracture or dislocation.

## 2016-08-10 IMAGING — DX DG FOOT COMPLETE 3+V*R*
3 series · 3 of 3 positions shown · non-contrast
Comparison: None.

CLINICAL DATA: Acute onset of right foot and ankle pain. Initial
encounter.

EXAM:
RIGHT FOOT COMPLETE - 3+ VIEW

[foot ap]
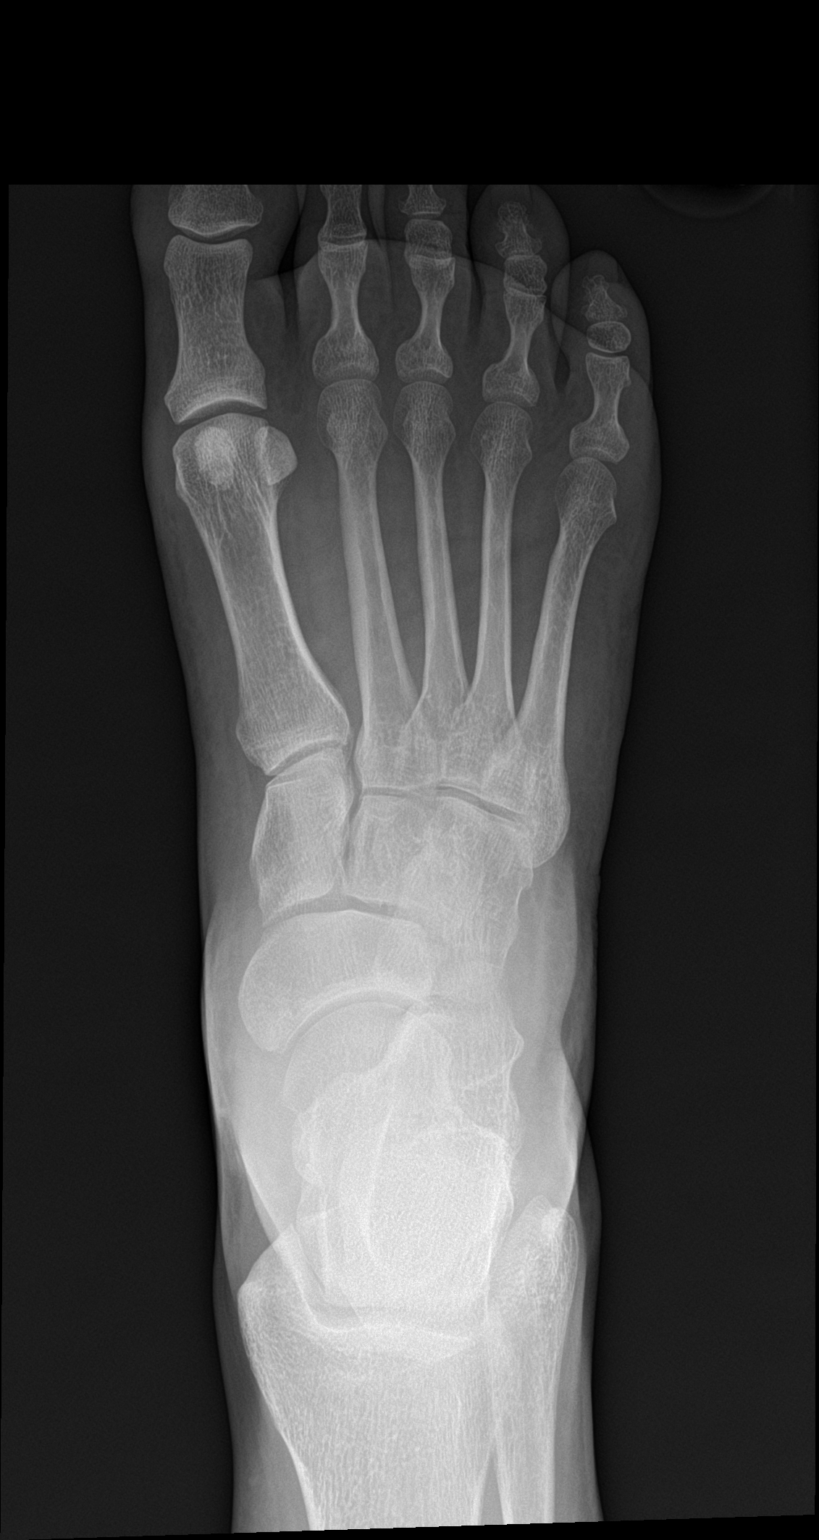

[foot obl]
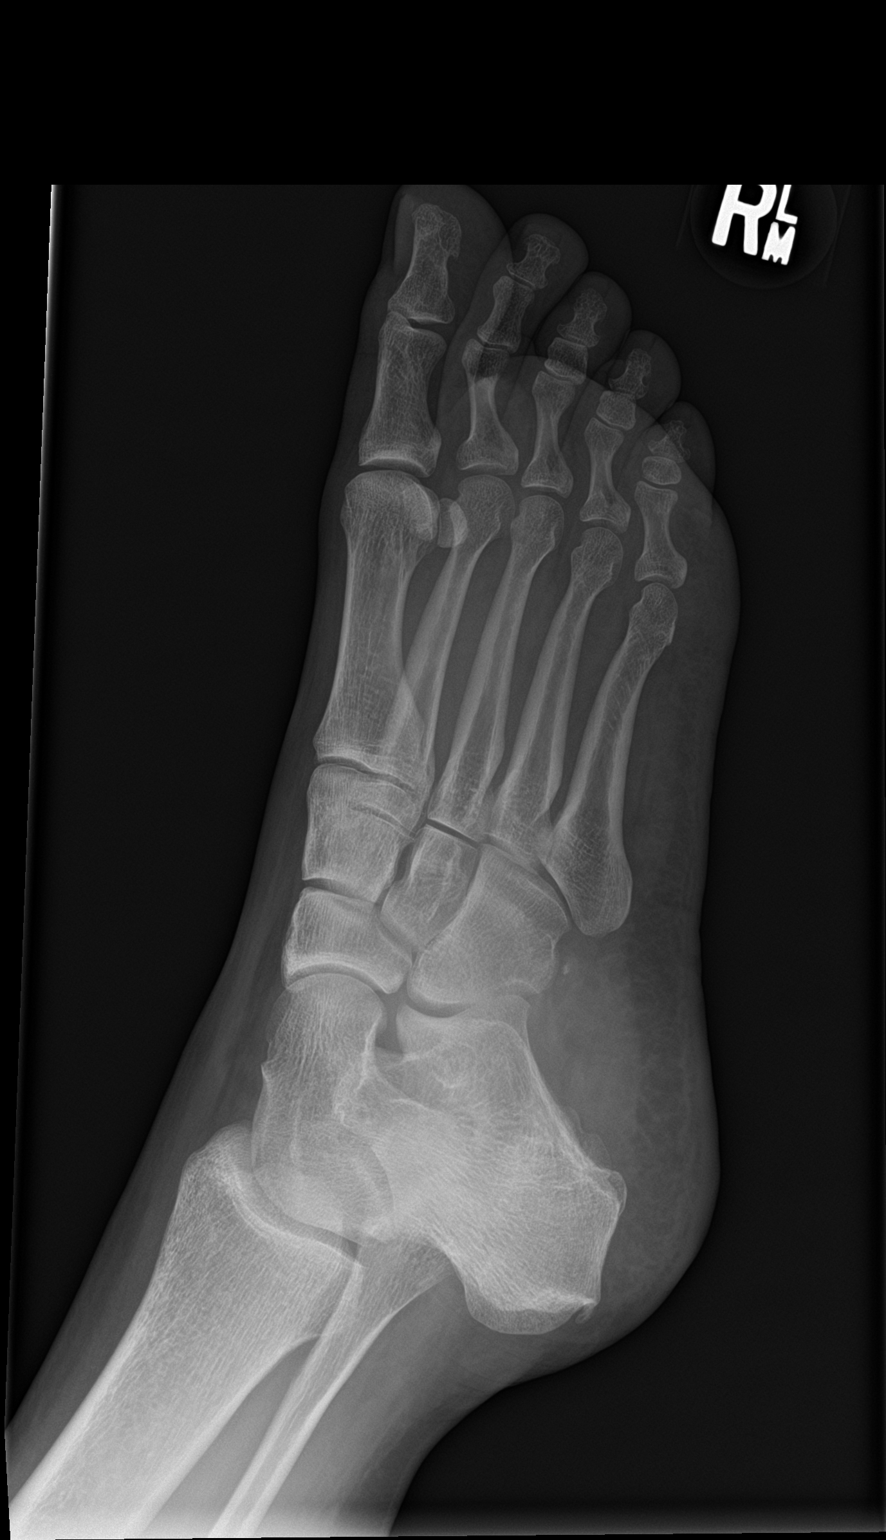

[foot lat]
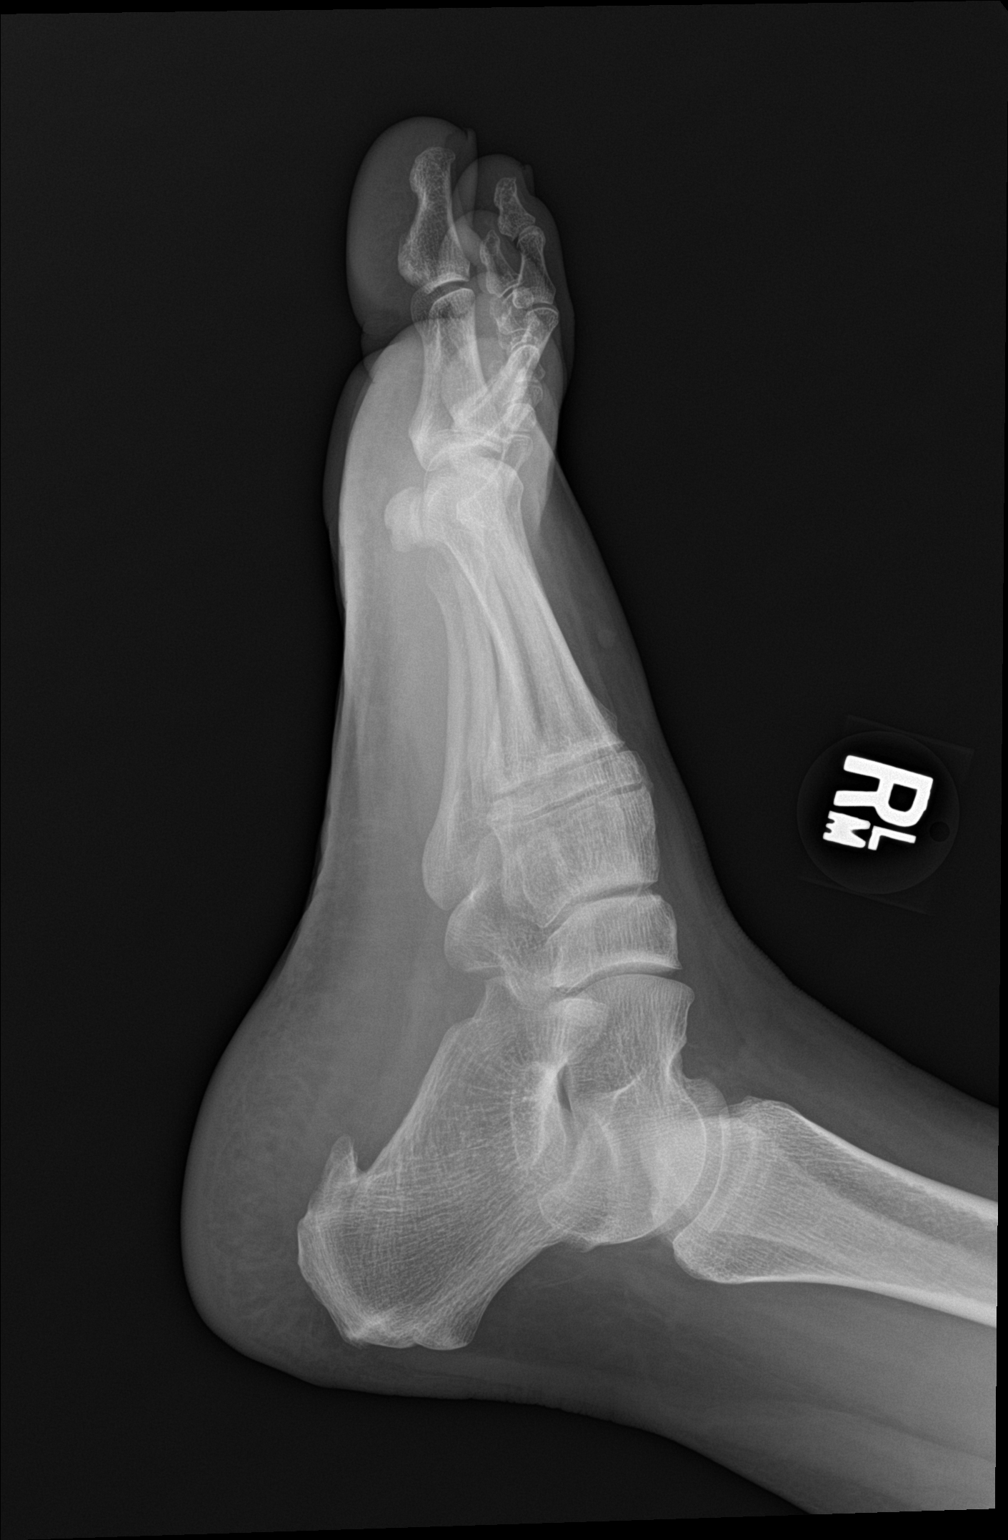

[3 of 3 positions shown; findings below may reference images not displayed]

FINDINGS: There is no evidence of fracture or dislocation. The joint spaces
are preserved. There is no evidence of talar subluxation; the
subtalar joint is unremarkable in appearance. A tiny os peroneum is
noted. A plantar calcaneal spur is seen.

No significant soft tissue abnormalities are seen.
IMPRESSION: 1. No evidence of fracture or dislocation.
2. Tiny os peroneum noted.

## 2017-06-23 ENCOUNTER — Emergency Department
Admission: EM | Admit: 2017-06-23 | Discharge: 2017-06-23 | Disposition: A | Discharge status: Home / Self Care | Payer: Medicaid Other | Attending: Emergency Medicine | Admitting: Emergency Medicine

## 2017-06-23 ENCOUNTER — Encounter: Payer: Self-pay | Admitting: *Deleted

## 2017-06-23 ENCOUNTER — Emergency Department: Payer: Medicaid Other

## 2017-06-23 DIAGNOSIS — Y929 Unspecified place or not applicable: Secondary | ICD-10-CM | POA: Insufficient documentation

## 2017-06-23 DIAGNOSIS — W010XXA Fall on same level from slipping, tripping and stumbling without subsequent striking against object, initial encounter: Secondary | ICD-10-CM | POA: Diagnosis not present

## 2017-06-23 DIAGNOSIS — M5441 Lumbago with sciatica, right side: Secondary | ICD-10-CM | POA: Diagnosis not present

## 2017-06-23 DIAGNOSIS — F17210 Nicotine dependence, cigarettes, uncomplicated: Secondary | ICD-10-CM | POA: Diagnosis not present

## 2017-06-23 DIAGNOSIS — Y998 Other external cause status: Secondary | ICD-10-CM | POA: Insufficient documentation

## 2017-06-23 DIAGNOSIS — M5442 Lumbago with sciatica, left side: Secondary | ICD-10-CM | POA: Diagnosis not present

## 2017-06-23 DIAGNOSIS — Y9389 Activity, other specified: Secondary | ICD-10-CM | POA: Insufficient documentation

## 2017-06-23 DIAGNOSIS — M549 Dorsalgia, unspecified: Secondary | ICD-10-CM | POA: Diagnosis present

## 2017-06-23 DIAGNOSIS — G8929 Other chronic pain: Secondary | ICD-10-CM | POA: Diagnosis not present

## 2017-06-23 DIAGNOSIS — W19XXXA Unspecified fall, initial encounter: Secondary | ICD-10-CM

## 2017-06-23 MED ORDER — MELOXICAM 15 MG PO TABS: 15.0000 mg | ORAL_TABLET | Freq: Every day | ORAL | 0 refills | Status: AC
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
## 2017-06-23 NOTE — ED Notes (Signed)
Pt back from x-ray.

## 2017-06-23 NOTE — ED Notes (Signed)
Pt left prior to discharge information being given to her - pt verbalizes to staff that she is upset over not being able to get pain narcotics  "I am leaving because I cannot get the medication I need since I fell."

## 2017-06-23 NOTE — ED Triage Notes (Signed)
States she slipped on the stairs today and has back pain, hx of chronic back pain, denies any head injury
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
## 2018-03-10 ENCOUNTER — Emergency Department (HOSPITAL_COMMUNITY): Payer: Medicaid Other

## 2018-03-10 ENCOUNTER — Emergency Department (HOSPITAL_COMMUNITY)
Admission: EM | Admit: 2018-03-10 | Discharge: 2018-03-10 | Disposition: A | Discharge status: Home / Self Care | Payer: Medicaid Other | Attending: Emergency Medicine | Admitting: Emergency Medicine

## 2018-03-10 ENCOUNTER — Encounter (HOSPITAL_COMMUNITY): Payer: Self-pay | Admitting: Emergency Medicine

## 2018-03-10 DIAGNOSIS — M5431 Sciatica, right side: Secondary | ICD-10-CM | POA: Diagnosis not present

## 2018-03-10 DIAGNOSIS — Z79899 Other long term (current) drug therapy: Secondary | ICD-10-CM | POA: Diagnosis not present

## 2018-03-10 DIAGNOSIS — M545 Low back pain: Secondary | ICD-10-CM | POA: Diagnosis present

## 2018-03-10 DIAGNOSIS — F17210 Nicotine dependence, cigarettes, uncomplicated: Secondary | ICD-10-CM | POA: Diagnosis not present

## 2018-03-10 DIAGNOSIS — W19XXXA Unspecified fall, initial encounter: Secondary | ICD-10-CM

## 2018-03-10 MED ORDER — METHOCARBAMOL 500 MG PO TABS: 1000.0000 mg | ORAL_TABLET | Freq: Four times a day (QID) | ORAL | 0 refills | Status: AC

## 2018-03-10 MED ORDER — METHOCARBAMOL 500 MG PO TABS
1000.0000 mg | ORAL_TABLET | Freq: Once | ORAL | Status: DC
  Filled 2018-03-10: qty 2

## 2018-03-10 MED ORDER — TRAMADOL HCL 50 MG PO TABS: 50.0000 mg | ORAL_TABLET | Freq: Once | ORAL | Status: DC

## 2018-03-10 MED ORDER — NAPROXEN 500 MG PO TABS: 500.0000 mg | ORAL_TABLET | Freq: Two times a day (BID) | ORAL | 0 refills | Status: AC

## 2018-03-10 MED ORDER — TRAMADOL HCL 50 MG PO TABS: 50.0000 mg | ORAL_TABLET | Freq: Four times a day (QID) | ORAL | 0 refills | Status: AC | PRN

## 2018-03-10 MED ORDER — NAPROXEN 250 MG PO TABS
500.0000 mg | ORAL_TABLET | Freq: Once | ORAL | Status: DC
  Filled 2018-03-10: qty 2
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
## 2018-03-10 NOTE — ED Triage Notes (Signed)
Pt reports pain going down right leg "for 2 months" causing her to fall.  C/o right hip and back pain from fall.

## 2018-03-10 NOTE — ED Notes (Signed)
PT refused any further treatment including VS at discharge, tramadol. PT tore off her prescriptions from the d/c packet and said she didn't want them but ended up taking them with her.

## 2018-03-10 NOTE — Discharge Instructions (Addendum)
Take your medicines as prescribed.   Avoid lifting,  Bending,  Twisting or any other activity that worsens your pain over the next week.  Apply an  icepack  to your lower back for 10-15 minutes every 2 hours for the next 2 days. Do not drive within 4 hours of taking tramadol as this medicine can make you drowsy. You should get rechecked if your symptoms are not better over the next 5 days,  Or you develop increased pain,  Weakness in your leg(s) or loss of bladder or bowel function - these are symptoms of a worse injury.

## 2018-03-10 NOTE — ED Notes (Signed)
Pt states she does not want the meds ordered. States they are not going to help her. PA aware
